# Patient Record
Sex: Female | Born: 1970 | Race: White | Hispanic: No | Marital: Married | Smoking: Never smoker
Health system: Southern US, Community
[De-identification: ages and names within clinical notes are randomized; demographics above are authoritative.]

## PROBLEM LIST (undated history)

## (undated) DIAGNOSIS — J302 Other seasonal allergic rhinitis: Secondary | ICD-10-CM

## (undated) DIAGNOSIS — J45909 Unspecified asthma, uncomplicated: Secondary | ICD-10-CM

## (undated) HISTORY — DX: Other seasonal allergic rhinitis: J30.2

## (undated) HISTORY — PX: CHOLECYSTECTOMY: SHX55

## (undated) HISTORY — DX: Unspecified asthma, uncomplicated: J45.909

---

## 2000-02-15 ENCOUNTER — Encounter: Admission: RE | Admit: 2000-02-15 | Discharge: 2000-05-15 | Payer: Self-pay | Admitting: Family Medicine

## 2007-11-16 ENCOUNTER — Emergency Department (HOSPITAL_COMMUNITY): Admission: EM | Admit: 2007-11-16 | Discharge: 2007-11-16 | Payer: Self-pay | Admitting: Emergency Medicine

## 2007-12-01 ENCOUNTER — Ambulatory Visit (HOSPITAL_COMMUNITY): Admission: RE | Admit: 2007-12-01 | Discharge: 2007-12-01 | Payer: Self-pay | Admitting: General Surgery

## 2007-12-01 ENCOUNTER — Encounter (INDEPENDENT_AMBULATORY_CARE_PROVIDER_SITE_OTHER): Payer: Self-pay | Admitting: General Surgery

## 2009-01-30 IMAGING — US US ABDOMEN COMPLETE
1 series · 14 of 25 positions shown · non-contrast
Comparison: NONE

CLINICAL DATA: c:  [REDACTED] and Patient   
Possible  gallstones. 

LIMITED ULTRASOUND OF THE RIGHT UPPER QUADRANT

[Series 1: us abd limited · 0.28mm/px · 14 of 48 slices shown]
[im 1/48]
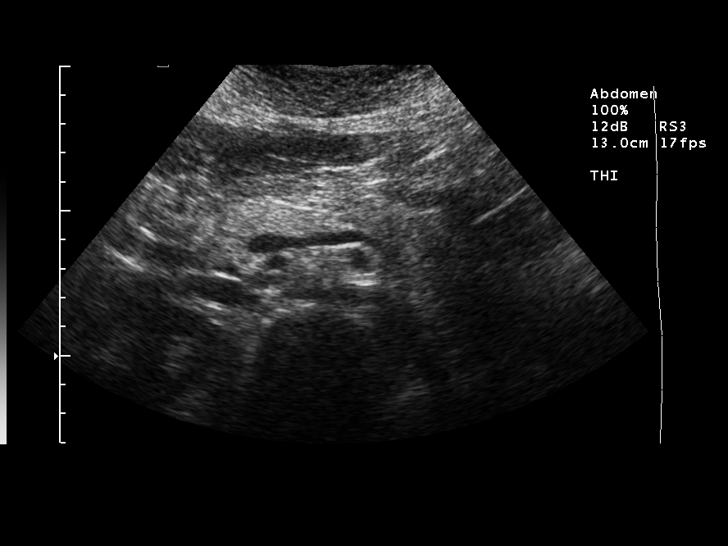
[im 4/48]
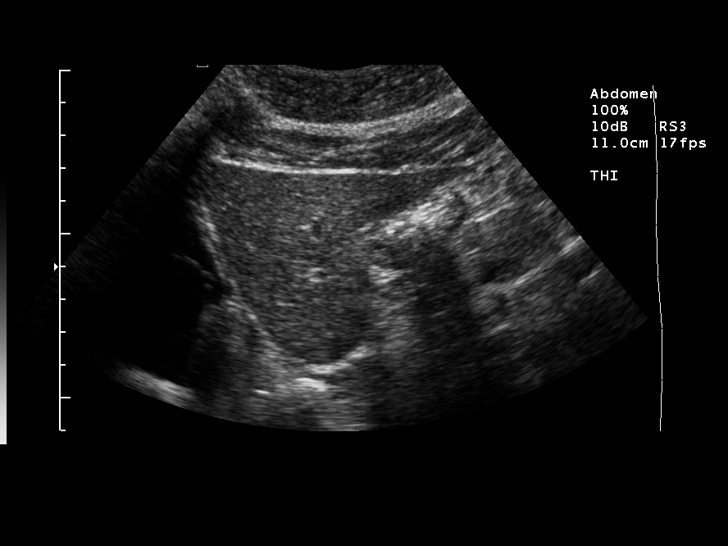
[im 8/48]
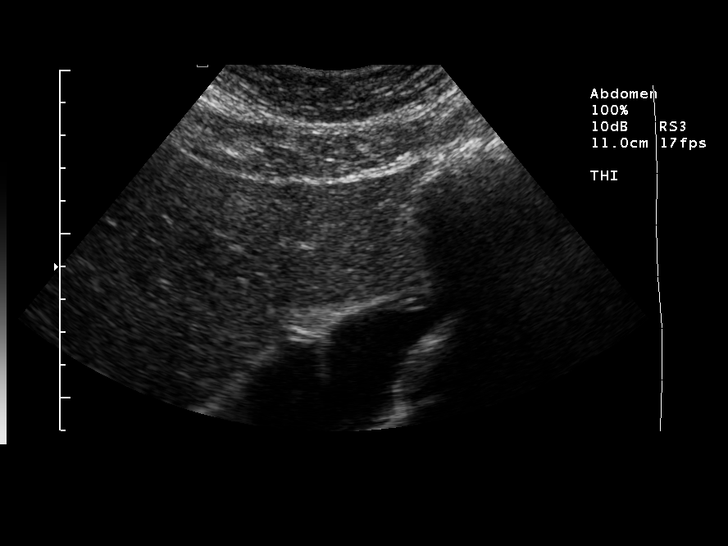
[im 12/48]
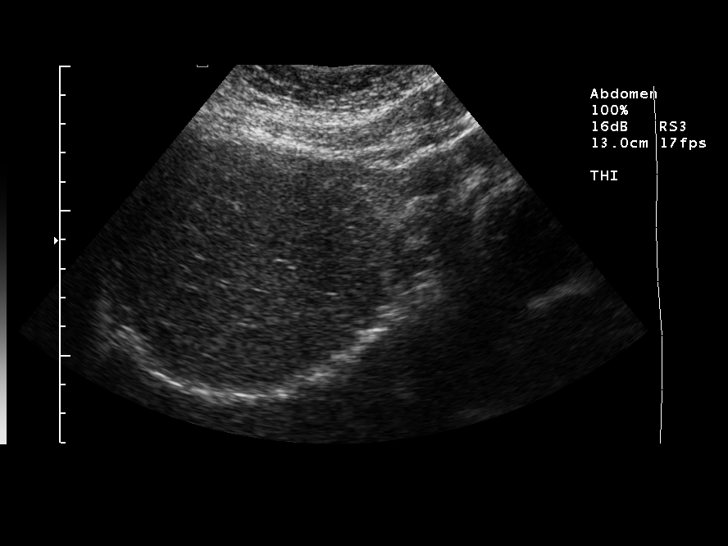
[im 16/48]
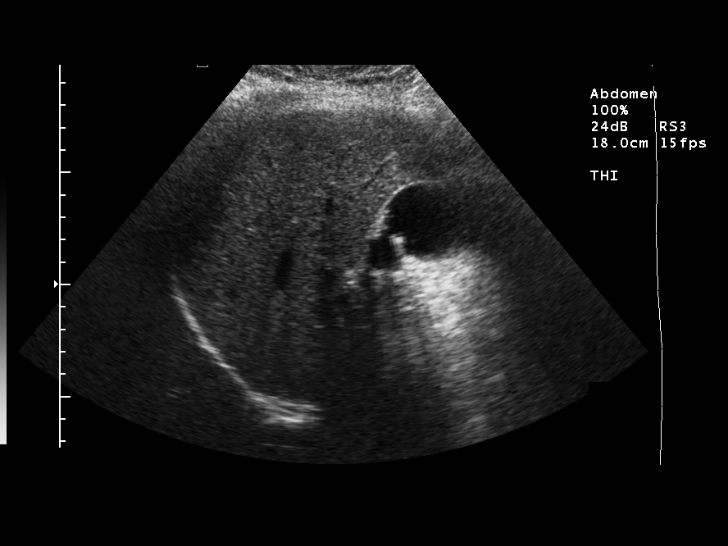
[im 18/48]
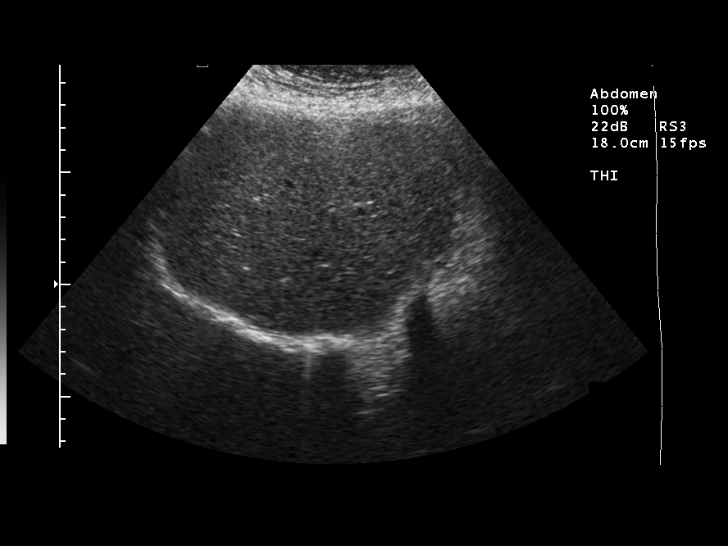
[im 22/48]
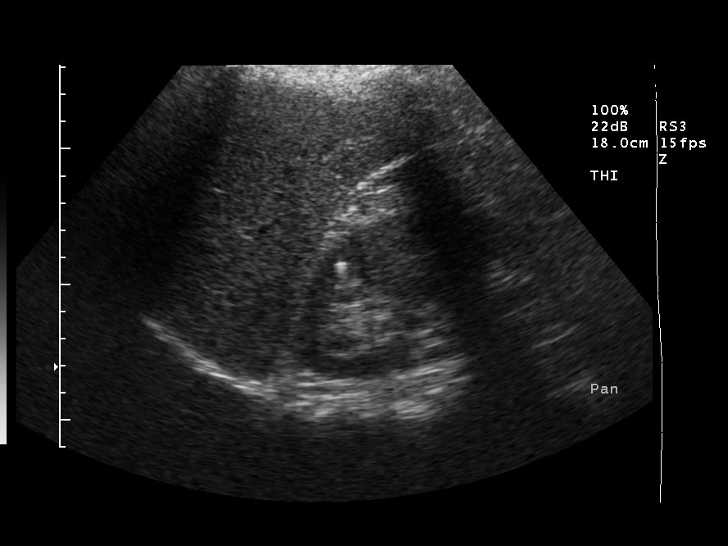
[im 26/48]
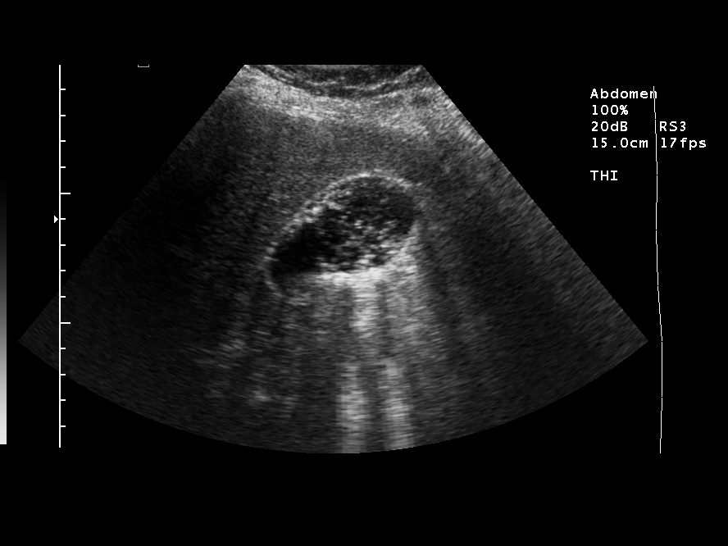
[im 30/48]
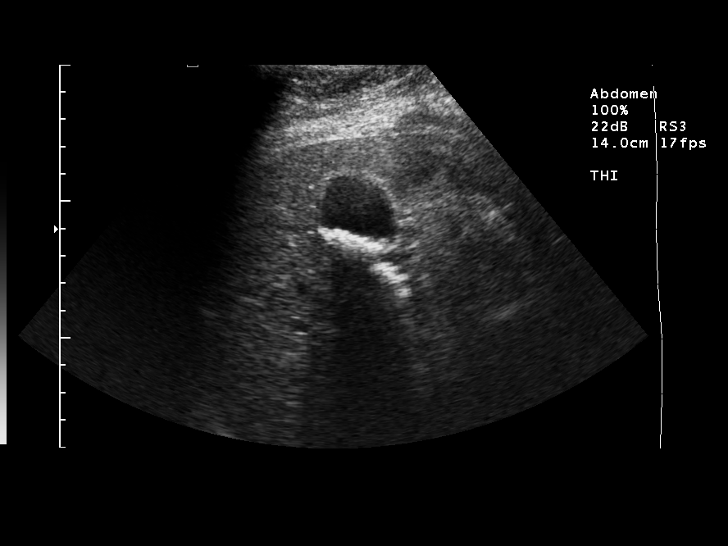
[im 32/48]
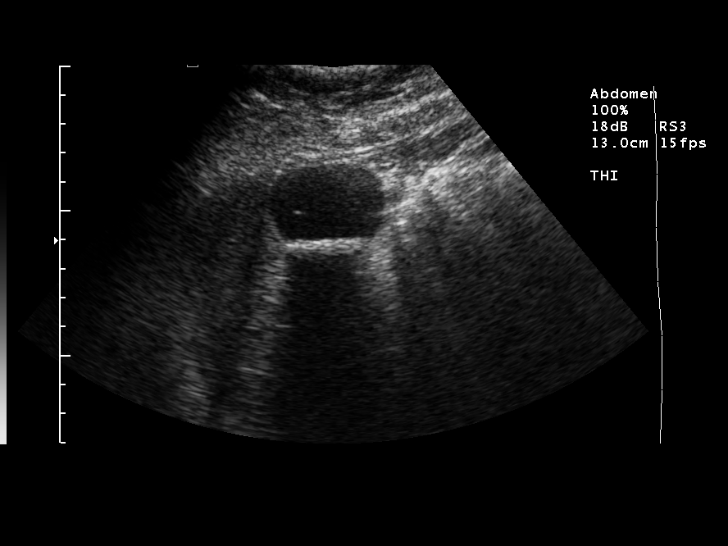
[im 36/48]
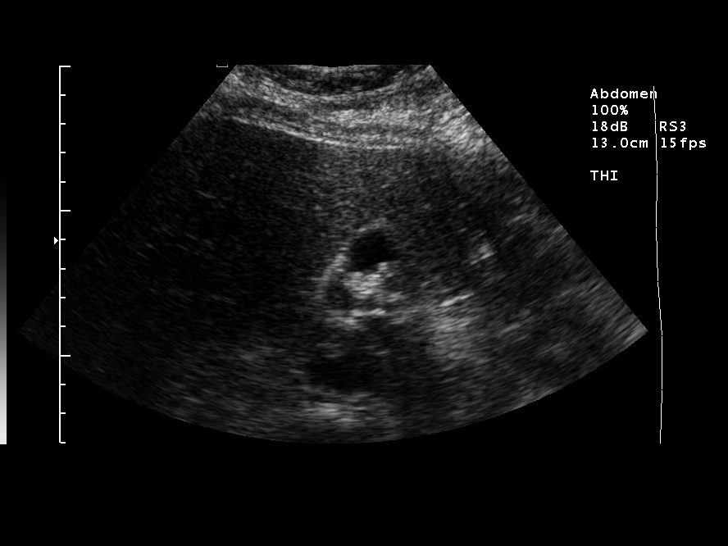
[im 40/48]
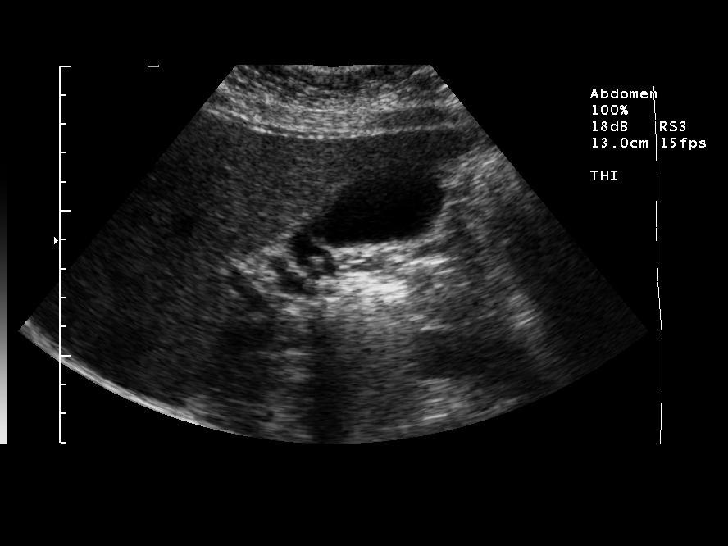
[im 44/48]
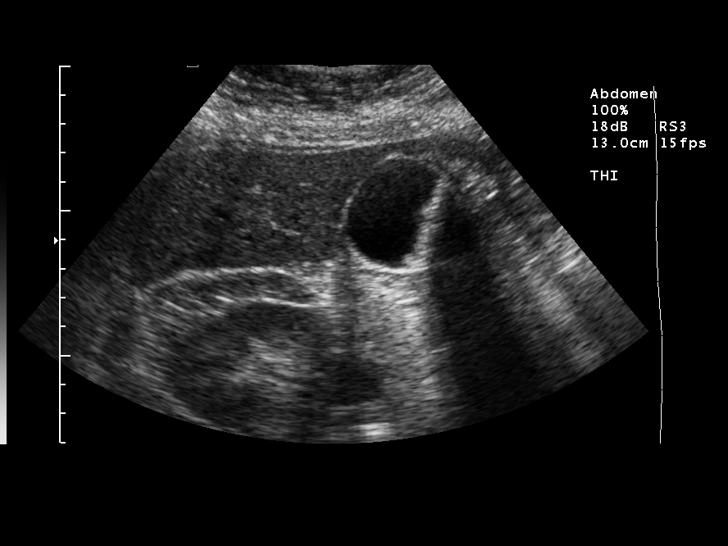
[im 48/48]
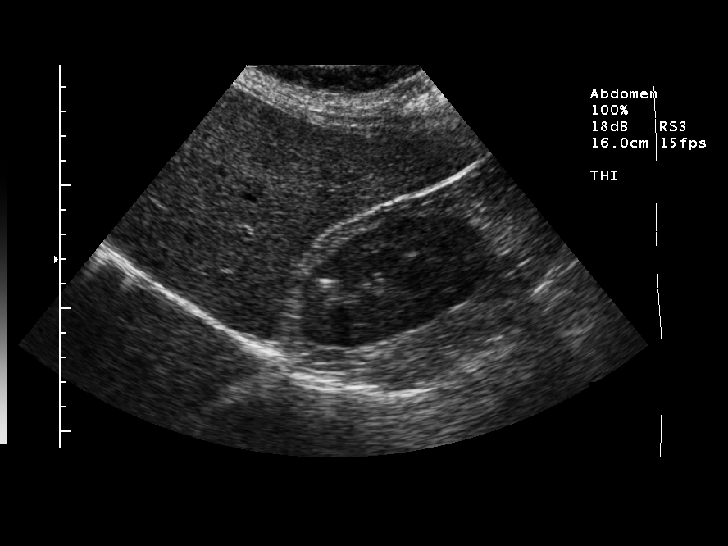

[14 of 25 positions shown; findings below may reference images not displayed]

FINDINGS: Prior CT urogram dated 11-10-07 was reviewed.  There 
was the suggestion of gallstone within the gallbladder. The liver 
is normal in size and echogenicity. Examination of the gallbladder 
demonstrates numerous shadowing echogenicities within the 
gallbladder consistent with gallstones.  The common duct was 
non-dilated measuring 0.34 cm. Non-shadowing material within the 
gallbladder also suggests associated sludge. The pancreas was 
normal in appearance. Views of the right kidney demonstrate a 
focal hyperechoic nodular structure in the upper pole cortex 
measuring 0.6 cm.  This demonstrates ring-down artifact suggestive 
of fatty tissue and this may represent a small angiomyolipoma.
IMPRESSION: Numerous tiny gallstones within the gallbladder with 
probable associated sludge. Normal width of the common bile duct. 
Probable tiny angiomyolipoma within the right kidney. Princess Marko 
Hallel, M.D. electronically reviewed on 11/16/2007 Dict Date: 
11/16/2007  Tran Date: 11/16/2007 CAV  JIM

## 2009-07-17 ENCOUNTER — Other Ambulatory Visit: Admission: RE | Admit: 2009-07-17 | Discharge: 2009-07-17 | Payer: Self-pay | Admitting: Obstetrics and Gynecology

## 2009-07-24 ENCOUNTER — Ambulatory Visit (HOSPITAL_COMMUNITY): Admission: RE | Admit: 2009-07-24 | Discharge: 2009-07-24 | Payer: Self-pay | Admitting: Obstetrics and Gynecology

## 2010-07-07 ENCOUNTER — Other Ambulatory Visit: Admission: RE | Admit: 2010-07-07 | Discharge: 2010-07-07 | Payer: Self-pay | Admitting: Obstetrics and Gynecology

## 2011-02-23 NOTE — Op Note (Signed)
NAME:  Sharon Mckenzie, Sharon Mckenzie              ACCOUNT NO.:  [id number]   MEDICAL RECORD NO.:  [id number]          PATIENT TYPE:  AMB   LOCATION:  SDS                          FACILITY:  [facility]   PHYSICIAN:  Cherylynn Ridges, M.D.    DATE OF BIRTH:  Aug 06, 1971   DATE OF PROCEDURE:  12/01/2007  DATE OF DISCHARGE:                               OPERATIVE REPORT   PREOPERATIVE DIAGNOSIS:  Symptomatic cholelithiasis.   POSTOPERATIVE DIAGNOSIS:  Symptomatic cholelithiasis.   PROCEDURE:  Laparoscopic cholecystectomy with cholangiogram.   SURGEON:  Cherylynn Ridges, M.D.   ASSISTANT:  Leonie Man, M.D.   ANESTHESIA:  General endotracheal.   ESTIMATED BLOOD LOSS:  Less than 20 mL.   COMPLICATIONS:  None.   CONDITION:  Stable.   INDICATIONS FOR OPERATION:  The patient is a 40 year old with  symptomatic gallstones who comes in now for an elective laparoscopic  cholecystectomy.   FINDINGS:  The patient had adhesions to the body and dome of the  gallbladder tenting up the duodenum.  Intraoperative cholangiogram was  normal with good flow into the duodenum and no filling defect   OPERATION:  The patient was taken to the operating room and placed on  the table in a supine position.  After an adequate general endotracheal  anesthetic was administered, she was prepped and draped in the usual  sterile manner exposing the midline and right upper quadrant.  A  supraumbilical curvilinear incision was made using a #11 blade and taken  down to the midline fascia.  We then split the fascia using a #15 blade  and then grabbed the edges with Kocher clamps.  We bluntly dissected  down to the peritoneal cavity with the Kelly clamp and then passed a  pursestring suture of 0 Vicryl around the fascial opening.  This secured  in a Hassan cannula which was subsequently passed through the fascial  opening into the peritoneal cavity.  Once the [facility] cannula was in  place, we insufflated carbon dioxide gas up to maximal  intra-abdominal  pressure of 15 mmHg.  Two right costal margin 5 mm cannulae and a  subxiphoid 11/12 mm cannula were passed under direct vision.  With all  cannulae in place, the patient was placed in reversed Trendelenburg, the  left side was tilted down, and we began the dissection..   A grasper was passed onto the body of the gallbladder and retracted  towards right upper quadrant and the anterior abdominal wall.  We then  dissected out the adhesions on the body and the dome of the gallbladder  using the dissector freeing up the infundibulum which was subsequently  retracted separately.  We opened up the peritoneum overlying the  triangle of Calot and hepatoduodenal triangle where a large cystic duct,  cystic artery was identified and clipped proximally and distally and  transected.  The cystic duct was clipped along the gallbladder side and  a cholecystoductotomy made through which a cholangiocatheter was passed  through the anterior abdominal wall and was passed for the  cholangiogram.  It was secured in place with the clip.  Cholangiogram  showed good flow into the duodenum, no filling defects, good proximal  flow, and no ductal dilatation.   Once the cholangiogram was completed, the clip securing it in place was  removed, the cholangiocatheter was removed from the abdominal cavity,  and distal clips x2 were placed on the cystic duct.  We transected the  cystic duct and dissected out the gallbladder from its bed with minimal  difficulty.  We retrieved it from the supraumbilical site using a  laparoscopic grasper.  Once this was done, we inspected the bed for  hemostasis and there was minimal bleeding.  We did cauterize the liver  bed.  We closed the supraumbilical fascial site using the pursestring  suture which was in place. We irrigated with saline and aspirated all  fluid and gas from above the liver as we removed all cannulae.  Once  this was done, 0.25% Marcaine with epi was  injected at all sites.  The  skin at the supraumbilical and subxiphoid sites were closed using  running subcuticular stitch of 4-0 Vicryl.  The lateral cannulae sites  were closed with Dermabond.  Dermabond was applied to the closed  incisions at the supraumbilical and subxiphoid sites.  Steri-Strips were  applied along with Tegaderm.  All needle counts, sponge counts and  instrument counts were correct.      Cherylynn Ridges, M.D.  Electronically Signed     JOW/MEDQ  D:  12/01/2007  T:  12/02/2007  Job:  [id number]   cc:   Oley Balm. Georgina Pillion, M.D.

## 2011-07-01 ENCOUNTER — Other Ambulatory Visit: Payer: Self-pay | Admitting: Family Medicine

## 2011-07-02 LAB — URINALYSIS, ROUTINE W REFLEX MICROSCOPIC
Bilirubin Urine: NEGATIVE
Specific Gravity, Urine: 1.024
Urobilinogen, UA: 0.2
pH: 5

## 2011-07-02 LAB — COMPREHENSIVE METABOLIC PANEL
ALT: 37 — ABNORMAL HIGH
ALT: 26
AST: 37
Chloride: 104
Creatinine, Ser: 0.72
GFR calc non Af Amer: >60
Glucose, Bld: 128 — ABNORMAL HIGH
Potassium: 3.6
Sodium: 137
Sodium: 138
Total Protein: 7.4
Total Protein: 7.5

## 2011-07-02 LAB — URINE MICROSCOPIC-ADD ON

## 2011-07-02 LAB — POCT PREGNANCY, URINE: Preg Test, Ur: NEGATIVE

## 2011-07-02 LAB — DIFFERENTIAL
Basophils Absolute: 0.1
Basophils Relative: 0
Eosinophils Absolute: 0.2
Eosinophils Relative: 2
Eosinophils Relative: 4
Lymphocytes Relative: 12
Lymphocytes Relative: 27
Lymphs Abs: 1.9
Lymphs Abs: 2.4
Monocytes Relative: 7
Neutro Abs: 12.6 — ABNORMAL HIGH
Neutro Abs: 5.5
Neutrophils Relative %: 82 — ABNORMAL HIGH
Neutrophils Relative %: 62

## 2011-07-02 LAB — LIPASE, BLOOD: Lipase: 29

## 2011-07-02 LAB — CBC
MCV: 87.7
Platelets: 283
RDW: 12.7
WBC: 15.3 — ABNORMAL HIGH

## 2011-09-09 ENCOUNTER — Other Ambulatory Visit (HOSPITAL_COMMUNITY)
Admission: RE | Admit: 2011-09-09 | Discharge: 2011-09-09 | Disposition: A | Discharge status: Home / Self Care | Payer: BC Managed Care – PPO | Source: Ambulatory Visit | Attending: Obstetrics and Gynecology | Admitting: Obstetrics and Gynecology

## 2011-09-09 ENCOUNTER — Other Ambulatory Visit: Payer: Self-pay | Admitting: Nurse Practitioner

## 2011-09-09 DIAGNOSIS — Z01419 Encounter for gynecological examination (general) (routine) without abnormal findings: Secondary | ICD-10-CM | POA: Insufficient documentation

## 2012-07-03 ENCOUNTER — Encounter (INDEPENDENT_AMBULATORY_CARE_PROVIDER_SITE_OTHER): Payer: Self-pay | Admitting: General Surgery

## 2012-07-11 ENCOUNTER — Ambulatory Visit (INDEPENDENT_AMBULATORY_CARE_PROVIDER_SITE_OTHER): Payer: Self-pay | Admitting: Surgery

## 2012-07-14 ENCOUNTER — Ambulatory Visit (INDEPENDENT_AMBULATORY_CARE_PROVIDER_SITE_OTHER): Payer: BC Managed Care – PPO | Admitting: Surgery

## 2012-07-26 ENCOUNTER — Ambulatory Visit (INDEPENDENT_AMBULATORY_CARE_PROVIDER_SITE_OTHER): Payer: Self-pay | Admitting: Surgery

## 2012-09-13 ENCOUNTER — Ambulatory Visit (INDEPENDENT_AMBULATORY_CARE_PROVIDER_SITE_OTHER): Payer: 59 | Admitting: Family Medicine

## 2012-09-13 ENCOUNTER — Encounter: Payer: Self-pay | Admitting: Family Medicine

## 2012-09-13 VITALS — BP 120/84 | HR 68 | Temp 98.1°F | Ht 65.75 in | Wt 214.0 lb

## 2012-09-13 DIAGNOSIS — Z0000 Encounter for general adult medical examination without abnormal findings: Secondary | ICD-10-CM

## 2012-09-13 DIAGNOSIS — Z136 Encounter for screening for cardiovascular disorders: Secondary | ICD-10-CM

## 2012-09-13 LAB — COMPREHENSIVE METABOLIC PANEL
Alkaline Phosphatase: 64 U/L (ref 39–117)
CO2: 29 mEq/L (ref 19–32)
Creatinine, Ser: 0.7 mg/dL (ref 0.4–1.2)
Glucose, Bld: 99 mg/dL (ref 70–99)
Sodium: 136 mEq/L (ref 135–145)
Total Bilirubin: 0.7 mg/dL (ref 0.3–1.2)

## 2012-09-13 LAB — LIPID PANEL
Cholesterol: 233 mg/dL — ABNORMAL HIGH (ref 0–200)
Total CHOL/HDL Ratio: 5
Triglycerides: 169 mg/dL — ABNORMAL HIGH (ref 0.0–149.0)
VLDL: 33.8 mg/dL (ref 0.0–40.0)

## 2012-09-13 LAB — LDL CHOLESTEROL, DIRECT: Direct LDL: 164.6 mg/dL

## 2012-09-13 NOTE — Progress Notes (Signed)
Subjective:    Patient ID: Sharon Mckenzie, female    DOB: September 14, 1971, 41 y.o.   MRN: [id number]  HPI  Very pleasant 41 yo G0 here to establish care and for CPX.  Last pap smear normal 08/2011.  No h/o abnormal pap smears.  She is sexually active with one partner only. No family history of breast, uterine, cervical or ovarian cancer. Denies any dysuria or vaginal discharge.  Has had a flu shot.  Due for routine lab work.  She did fall on stairs a few weeks- tail bone very sore but that is improving. No LE radiculopathy or weakness.  Patient Active Problem List  Diagnosis  . Routine general medical examination at a health care facility   Past Medical History  Diagnosis Date  . Asthma   . Seasonal allergies    Past Surgical History  Procedure Date  . Cholecystectomy    History  Substance Use Topics  . Smoking status: Never Smoker   . Smokeless tobacco: Not on file  . Alcohol Use: 1.0 - 1.5 oz/week    2-3 drink(s) per week   Family History  Problem Relation Age of Onset  . Hyperlipidemia Mother   . Hypertension Mother    No Known Allergies Current Outpatient Prescriptions on File Prior to Visit  Medication Sig Dispense Refill  . levonorgestrel (MIRENA) 20 MCG/24HR IUD 1 each by Intrauterine route once.      . Multiple Vitamin (MULTIVITAMIN) tablet Take 1 tablet by mouth daily.       The PMH, PSH, Social History, Family History, Medications, and allergies have been reviewed in [facility], and have been updated if relevant.    Review of Systems See HPI Patient reports no  vision/ hearing changes,anorexia, weight change, fever ,adenopathy, persistant / recurrent hoarseness, swallowing issues, chest pain, edema,persistant / recurrent cough, hemoptysis, dyspnea(rest, exertional, paroxysmal nocturnal), gastrointestinal  bleeding (melena, rectal bleeding), abdominal pain, excessive heart burn, GU symptoms(dysuria, hematuria, pyuria, voiding/incontinence  Issues) syncope, focal  weakness, severe memory loss, concerning skin lesions, depression, anxiety, abnormal bruising/bleeding, major joint swelling, breast masses or abnormal vaginal bleeding.       Objective:   Physical Exam BP 120/84  Pulse 68  Temp 98.1 F (36.7 C)  Ht 5' 5.75" (1.67 m)  Wt 214 lb (97.07 kg)  BMI 34.80 kg/m2  General:  Well-developed,well-nourished,in no acute distress; alert,appropriate and cooperative throughout examination Head:  normocephalic and atraumatic.   Eyes:  vision grossly intact, pupils equal, pupils round, and pupils reactive to light.   Ears:  R ear normal and L ear normal.   Nose:  no external deformity.   Mouth:  good dentition.   Neck:  No deformities, masses, or tenderness noted. Breasts:  No mass, nodules, thickening, tenderness, bulging, retraction, inflamation, nipple discharge or skin changes noted.   Lungs:  Normal respiratory effort, chest expands symmetrically. Lungs are clear to auscultation, no crackles or wheezes. Heart:  Normal rate and regular rhythm. S1 and S2 normal without gallop, murmur, click, rub or other extra sounds. Abdomen:  Bowel sounds positive,abdomen soft and non-tender without masses, organomegaly or hernias noted. Msk:  No deformity or scoliosis noted of thoracic or lumbar spine.   Extremities:  No clubbing, cyanosis, edema, or deformity noted with normal full range of motion of all joints.   Neurologic:  alert & oriented X3 and gait normal.   Skin:  Intact without suspicious lesions or rashes Cervical Nodes:  No lymphadenopathy noted Axillary Nodes:  No palpable lymphadenopathy Psych:  Cognition and judgment appear intact. Alert and cooperative with normal attention span and concentration. No apparent delusions, illusions, hallucinations     Assessment & Plan:   1. Routine general medical examination at a health care facility  Reviewed preventive care protocols, scheduled due services, and updated immunizations Discussed nutrition,  exercise, diet, and healthy lifestyle.  Comprehensive metabolic panel  2. Screening for ischemic heart disease  Lipid Panel

## 2012-09-13 NOTE — Patient Instructions (Addendum)
It was wonderful to meet you. We will call you with your lab results.  Have a wonderful holiday season.   Health Maintenance, Females A healthy lifestyle and preventative care can promote health and wellness.  Maintain regular health, dental, and eye exams.  Eat a healthy diet. Foods like vegetables, fruits, whole grains, low-fat dairy products, and lean protein foods contain the nutrients you need without too many calories. Decrease your intake of foods high in solid fats, added sugars, and salt. Get information about a proper diet from your caregiver, if necessary.  Regular physical exercise is one of the most important things you can do for your health. Most adults should get at least 150 minutes of moderate-intensity exercise (any activity that increases your heart rate and causes you to sweat) each week. In addition, most adults need muscle-strengthening exercises on 2 or more days a week.   Maintain a healthy weight. The body mass index (BMI) is a screening tool to identify possible weight problems. It provides an estimate of body fat based on height and weight. Your caregiver can help determine your BMI, and can help you achieve or maintain a healthy weight. For adults 20 years and older:  A BMI below 18.5 is considered underweight.  A BMI of 18.5 to 24.9 is normal.  A BMI of 25 to 29.9 is considered overweight.  A BMI of 30 and above is considered obese.  Maintain normal blood lipids and cholesterol by exercising and minimizing your intake of saturated fat. Eat a balanced diet with plenty of fruits and vegetables. Blood tests for lipids and cholesterol should begin at age 48 and be repeated every 5 years. If your lipid or cholesterol levels are high, you are over 50, or you are a high risk for heart disease, you may need your cholesterol levels checked more frequently.Ongoing high lipid and cholesterol levels should be treated with medicines if diet and exercise are not  effective.  If you smoke, find out from your caregiver how to quit. If you do not use tobacco, do not start.  If you are pregnant, do not drink alcohol. If you are breastfeeding, be very cautious about drinking alcohol. If you are not pregnant and choose to drink alcohol, do not exceed 1 drink per day. One drink is considered to be 12 ounces (355 mL) of beer, 5 ounces (148 mL) of wine, or 1.5 ounces (44 mL) of liquor.  Avoid use of street drugs. Do not share needles with anyone. Ask for help if you need support or instructions about stopping the use of drugs.  High blood pressure causes heart disease and increases the risk of stroke. Blood pressure should be checked at least every 1 to 2 years. Ongoing high blood pressure should be treated with medicines, if weight loss and exercise are not effective.  If you are 93 to 39 years old, ask your caregiver if you should take aspirin to prevent strokes.  Diabetes screening involves taking a blood sample to check your fasting blood sugar level. This should be done once every 3 years, after age 44, if you are within normal weight and without risk factors for diabetes. Testing should be considered at a younger age or be carried out more frequently if you are overweight and have at least 1 risk factor for diabetes.  Breast cancer screening is essential preventative care for women. You should practice "breast self-awareness." This means understanding the normal appearance and feel of your breasts and may include breast  self-examination. Any changes detected, no matter how small, should be reported to a caregiver. Women in their 46s and 30s should have a clinical breast exam (CBE) by a caregiver as part of a regular health exam every 1 to 3 years. After age 35, women should have a CBE every year. Starting at age 64, women should consider having a mammogram (breast X-ray) every year. Women who have a family history of breast cancer should talk to their caregiver  about genetic screening. Women at a high risk of breast cancer should talk to their caregiver about having an MRI and a mammogram every year.  The Pap test is a screening test for cervical cancer. Women should have a Pap test starting at age 55. Between ages 2 and 24, Pap tests should be repeated every 2 years. Beginning at age 35, you should have a Pap test every 3 years as long as the past 3 Pap tests have been normal. If you had a hysterectomy for a problem that was not cancer or a condition that could lead to cancer, then you no longer need Pap tests. If you are between ages 56 and 44, and you have had normal Pap tests going back 10 years, you no longer need Pap tests. If you have had past treatment for cervical cancer or a condition that could lead to cancer, you need Pap tests and screening for cancer for at least 20 years after your treatment. If Pap tests have been discontinued, risk factors (such as a new sexual partner) need to be reassessed to determine if screening should be resumed. Some women have medical problems that increase the chance of getting cervical cancer. In these cases, your caregiver may recommend more frequent screening and Pap tests.  The human papillomavirus (HPV) test is an additional test that may be used for cervical cancer screening. The HPV test looks for the virus that can cause the cell changes on the cervix. The cells collected during the Pap test can be tested for HPV. The HPV test could be used to screen women aged 66 years and older, and should be used in women of any age who have unclear Pap test results. After the age of 52, women should have HPV testing at the same frequency as a Pap test.  Colorectal cancer can be detected and often prevented. Most routine colorectal cancer screening begins at the age of 67 and continues through age 32. However, your caregiver may recommend screening at an earlier age if you have risk factors for colon cancer. On a yearly basis,  your caregiver may provide home test kits to check for hidden blood in the stool. Use of a small camera at the end of a tube, to directly examine the colon (sigmoidoscopy or colonoscopy), can detect the earliest forms of colorectal cancer. Talk to your caregiver about this at age 54, when routine screening begins. Direct examination of the colon should be repeated every 5 to 10 years through age 59, unless early forms of pre-cancerous polyps or small growths are found.  Hepatitis C blood testing is recommended for all people born from [birth year] through 1963 and any individual with known risks for hepatitis C.  Practice safe sex. Use condoms and avoid high-risk sexual practices to reduce the spread of sexually transmitted infections (STIs). Sexually active women aged 76 and younger should be checked for Chlamydia, which is a common sexually transmitted infection. Older women with new or multiple partners should also be tested for Chlamydia. Testing  for other STIs is recommended if you are sexually active and at increased risk.  Osteoporosis is a disease in which the bones lose minerals and strength with aging. This can result in serious bone fractures. The risk of osteoporosis can be identified using a bone density scan. Women ages 8 and over and women at risk for fractures or osteoporosis should discuss screening with their caregivers. Ask your caregiver whether you should be taking a calcium supplement or vitamin D to reduce the rate of osteoporosis.  Menopause can be associated with physical symptoms and risks. Hormone replacement therapy is available to decrease symptoms and risks. You should talk to your caregiver about whether hormone replacement therapy is right for you.  Use sunscreen with a sun protection factor (SPF) of 30 or greater. Apply sunscreen liberally and repeatedly throughout the day. You should seek shade when your shadow is shorter than you. Protect yourself by wearing long sleeves, pants,  a wide-brimmed hat, and sunglasses year round, whenever you are outdoors.  Notify your caregiver of new moles or changes in moles, especially if there is a change in shape or color. Also notify your caregiver if a mole is larger than the size of a pencil eraser.  Stay current with your immunizations. Document Released: 04/12/2011 Document Revised: 12/20/2011 Document Reviewed: 04/12/2011 [facility] Patient Information 2013 [place], [place].

## 2012-10-06 ENCOUNTER — Ambulatory Visit (INDEPENDENT_AMBULATORY_CARE_PROVIDER_SITE_OTHER): Payer: 59 | Admitting: Family Medicine

## 2012-10-06 ENCOUNTER — Encounter: Payer: Self-pay | Admitting: Family Medicine

## 2012-10-06 VITALS — BP 124/78 | HR 111 | Temp 99.1°F | Ht 65.75 in | Wt 217.2 lb

## 2012-10-06 DIAGNOSIS — J069 Acute upper respiratory infection, unspecified: Secondary | ICD-10-CM

## 2012-10-06 DIAGNOSIS — R21 Rash and other nonspecific skin eruption: Secondary | ICD-10-CM

## 2012-10-06 NOTE — Progress Notes (Signed)
  Subjective:    Patient ID: Sharon Mckenzie, female    DOB: October 24, 1970, 41 y.o.   MRN: [id number]  HPI Here for fever and rash   On dec 25th- had a rash on back of her neck - blistery, a little uncomfortable but not itchy- on both sides of neck  Yesterday- pressure on both sides of face Also a fever  100 max  No aches or chills  Ears feel full , and she has post nasal drip  No recent illness  Cough at night from post nasal drip   She did have her flu shot  Patient Active Problem List  Diagnosis  . Routine general medical examination at a health care facility   Past Medical History  Diagnosis Date  . Asthma   . Seasonal allergies    Past Surgical History  Procedure Date  . Cholecystectomy    History  Substance Use Topics  . Smoking status: Never Smoker   . Smokeless tobacco: Not on file  . Alcohol Use: 1.0 - 1.5 oz/week    2-3 drink(s) per week   Family History  Problem Relation Age of Onset  . Hyperlipidemia Mother   . Hypertension Mother    No Known Allergies Current Outpatient Prescriptions on File Prior to Visit  Medication Sig Dispense Refill  . levonorgestrel (MIRENA) 20 MCG/24HR IUD 1 each by Intrauterine route once.      . Multiple Vitamin (MULTIVITAMIN) tablet Take 1 tablet by mouth daily.          Review of Systems Review of Systems  Constitutional: Negative for appetite change,  and unexpected weight change.  Eyes: Negative for pain and visual disturbance.  ENT pos for congestion and ST and post nasal drip and neg for sinus tenderness  Respiratory: Negative for wheeze  and shortness of breath.   Cardiovascular: Negative for cp or palpitations    Gastrointestinal: Negative for nausea, diarrhea and constipation.  Genitourinary: Negative for urgency and frequency.  Skin: Negative for pallor and pos for resolving rash   Neurological: Negative for weakness, light-headedness, numbness and headaches.  Hematological: Negative for adenopathy. Does not  bruise/bleed easily.  Psychiatric/Behavioral: Negative for dysphoric mood. The patient is not nervous/anxious.         Objective:   Physical Exam  Constitutional: She appears well-developed and well-nourished. No distress.  HENT:  Head: Normocephalic and atraumatic.  Left Ear: External ear normal.  Mouth/Throat: Oropharynx is clear and moist. No oropharyngeal exudate.       Nares are injected and congested  No sinus tenderness Clear post nasal drip   Eyes: Conjunctivae normal and EOM are normal. Pupils are equal, round, and reactive to light. Right eye exhibits no discharge. Left eye exhibits no discharge. No scleral icterus.  Neck: Normal range of motion. Neck supple. No JVD present. No thyromegaly present.  Cardiovascular: Normal rate and regular rhythm.   Pulmonary/Chest: Effort normal and breath sounds normal. No respiratory distress. She has no wheezes. She has no rales.  Abdominal: Soft. Bowel sounds are normal.  Lymphadenopathy:    She has no cervical adenopathy.  Neurological: She is alert.  Skin: Skin is warm and dry. Rash noted. No erythema. No pallor.       Faint area of injection with papules on the back of neck- is dry- looks to be resolving   Psychiatric: She has a normal mood and affect.          Assessment & Plan:

## 2012-10-06 NOTE — Assessment & Plan Note (Signed)
This is almost totally resolved on its own- suspect allergic Reviewed her exposures Disc symptomatic care - see instructions on AVS  Update if not starting to improve in a week or if worsening

## 2012-10-06 NOTE — Patient Instructions (Addendum)
For rash- avoid fragrances and harsh soaps Try cort aid otc - update if no improvement For cold/ cough- zyrtec otc, fluids/ rest/ nasal saline spray and mucinex DM for cough if needed  Tylenol for fever  Update if not starting to improve in a week or if worsening

## 2012-10-06 NOTE — Assessment & Plan Note (Signed)
In early stages Disc symptomatic care - see instructions on AVS  Update if not starting to improve in a week or if worsening

## 2013-02-02 ENCOUNTER — Encounter: Payer: Self-pay | Admitting: Family Medicine

## 2013-02-02 ENCOUNTER — Ambulatory Visit (INDEPENDENT_AMBULATORY_CARE_PROVIDER_SITE_OTHER): Payer: 59 | Admitting: Family Medicine

## 2013-02-02 VITALS — BP 120/84 | HR 82 | Temp 98.4°F | Ht 65.75 in | Wt 217.8 lb

## 2013-02-02 DIAGNOSIS — B3731 Acute candidiasis of vulva and vagina: Secondary | ICD-10-CM | POA: Insufficient documentation

## 2013-02-02 DIAGNOSIS — B373 Candidiasis of vulva and vagina: Secondary | ICD-10-CM

## 2013-02-02 MED ORDER — FLUCONAZOLE 150 MG PO TABS: 150.0000 mg | ORAL_TABLET | Freq: Once | ORAL | Status: DC

## 2013-02-02 NOTE — Assessment & Plan Note (Signed)
Treat with diflucan x 1.  

## 2013-02-02 NOTE — Progress Notes (Signed)
  Subjective:    Patient ID: Sharon Mckenzie, female    DOB: [birth year], 42 y.o.   MRN: [id number]  Vaginal Itching The patient's primary symptoms include genital itching, a genital odor and a vaginal discharge. The patient's pertinent negatives include no genital lesions, genital rash, pelvic pain or vaginal bleeding. This is a new problem. The current episode started in the past 7 days. The problem occurs constantly. The patient is experiencing no pain. She is not pregnant. Pertinent negatives include no abdominal pain, chills, constipation, diarrhea, dysuria, fever, frequency, hematuria, nausea, urgency or vomiting. The vaginal discharge was white, thick and copious. There has been no bleeding. She has tried nothing for the symptoms. She is sexually active. No, her partner does not have an STD. She uses nothing for contraception. (No history of monistat.)      Review of Systems  Constitutional: Negative for fever and chills.  Gastrointestinal: Negative for nausea, vomiting, abdominal pain, diarrhea and constipation.  Genitourinary: Positive for vaginal discharge. Negative for dysuria, urgency, frequency, hematuria and pelvic pain.       Objective:   Physical Exam  Constitutional: She appears well-developed and well-nourished.  Eyes: Conjunctivae are normal. Pupils are equal, round, and reactive to light.  Cardiovascular: Normal rate and regular rhythm.   Pulmonary/Chest: Effort normal and breath sounds normal.  Abdominal: Soft. Bowel sounds are normal. There is no tenderness.  Genitourinary: There is rash on the right labia. There is rash on the left labia. There is erythema and tenderness around the vagina. No bleeding around the vagina.          Assessment & Plan:

## 2013-02-02 NOTE — Patient Instructions (Addendum)
Call if not improving as expected. 

## 2013-02-05 ENCOUNTER — Telehealth: Payer: Self-pay

## 2013-02-05 NOTE — Telephone Encounter (Signed)
Pt seen 02/02/13; pt took Diflucan on 02/02/13 and vaginal discharge, itching and odor is better but not gone. Pt did not know if needed more med or needs to give more time to work.Please advise. Pt request call back. [facility].

## 2013-02-05 NOTE — Telephone Encounter (Signed)
I would try to give it a little more time.  If no improvement, please let us know in next few days.

## 2013-02-05 NOTE — Telephone Encounter (Signed)
Advised patient as instructed, she agreed to give more time.

## 2013-02-15 ENCOUNTER — Other Ambulatory Visit (INDEPENDENT_AMBULATORY_CARE_PROVIDER_SITE_OTHER): Payer: 59

## 2013-02-15 ENCOUNTER — Encounter: Payer: Self-pay | Admitting: *Deleted

## 2013-02-15 DIAGNOSIS — E785 Hyperlipidemia, unspecified: Secondary | ICD-10-CM

## 2013-02-15 LAB — LIPID PANEL
HDL: 39.6 mg/dL (ref >39.00)
Total CHOL/HDL Ratio: 5
Triglycerides: 101 mg/dL (ref 0.0–149.0)
VLDL: 20.2 mg/dL (ref 0.0–40.0)

## 2013-03-22 ENCOUNTER — Telehealth: Payer: Self-pay | Admitting: Family Medicine

## 2013-03-22 NOTE — Telephone Encounter (Signed)
Patient Information:  Caller Name: Calista  Phone: [phone]  Patient: Sharon Mckenzie, Sharon Mckenzie  Gender: Female  DOB: 1971/05/27  Age: 42 Years  PCP: Ruthe Mannan [facility])  Pregnant: No  Office Follow Up:  Does the office need to follow up with this patient?: Yes  Instructions For The Office: Pt requesting recommendation to Podiatrist.  PLEASE F/U WITH PT, THANK YOU.   Symptoms  Reason For Call & Symptoms: Pt states she has pain on the ball of the right foot.  Pt requesting recommendation to a Podiatrist in [place] or [place].  Reviewed Health History In EMR: Yes  Reviewed Medications In EMR: Yes  Reviewed Allergies In EMR: Yes  Reviewed Surgeries / Procedures: Yes  Date of Onset of Symptoms: 02/19/2013 OB / GYN:  LMP: Unknown  Guideline(s) Used:  Foot Pain  Disposition Per Guideline:   See Within 2 Weeks in Office  Reason For Disposition Reached:   Mild pain (e.g., does not interfere with normal activities) and present > 7 days  Advice Given:  Call Back If:  Swelling, redness, or fever occur  You become worse.  Patient Will Follow Care Advice:  YES

## 2013-03-23 NOTE — Telephone Encounter (Signed)
I think a lot of people have been happy with Dr. Al Corpus.

## 2013-03-23 NOTE — Telephone Encounter (Signed)
Advised patient.  She will call back if she needs a referral.

## 2013-05-04 ENCOUNTER — Ambulatory Visit: Payer: 59 | Admitting: Family Medicine

## 2013-09-04 ENCOUNTER — Telehealth: Payer: Self-pay

## 2013-09-04 NOTE — Telephone Encounter (Signed)
Pt left v/m requesting prescription for first preventative mammogram; pt wants to go to [facility] in [place]. Pt would like to pick up when ready.Please advise.

## 2013-09-04 NOTE — Telephone Encounter (Signed)
She is due for her physical in 09/2013, we will order this at her visit, she needs to make an appt if she doesn't already have one

## 2013-09-07 NOTE — Telephone Encounter (Signed)
Pt called back re: mammo order. She has appt scheduled for cpx with Dr Dayton Martes on 11/07/13 (the earliest cpx appt available), but she says she wants to have her mammo done this year before her insurance switches over.

## 2013-09-10 ENCOUNTER — Other Ambulatory Visit: Payer: Self-pay | Admitting: Internal Medicine

## 2013-09-10 DIAGNOSIS — Z1239 Encounter for other screening for malignant neoplasm of breast: Secondary | ICD-10-CM

## 2013-09-10 NOTE — Telephone Encounter (Signed)
mammo ordered.

## 2013-09-13 ENCOUNTER — Ambulatory Visit: Payer: Self-pay | Admitting: Family Medicine

## 2013-09-14 ENCOUNTER — Encounter: Payer: Self-pay | Admitting: Family Medicine

## 2013-09-18 NOTE — Telephone Encounter (Signed)
Left message on voice mail  to call back

## 2013-09-26 ENCOUNTER — Ambulatory Visit: Payer: Self-pay | Admitting: Family Medicine

## 2013-09-26 ENCOUNTER — Encounter: Payer: Self-pay | Admitting: Family Medicine

## 2013-09-28 ENCOUNTER — Ambulatory Visit: Payer: Self-pay | Admitting: Family Medicine

## 2013-09-28 ENCOUNTER — Encounter: Payer: Self-pay | Admitting: Family Medicine

## 2013-09-28 NOTE — Telephone Encounter (Signed)
Left message on voicemail.

## 2013-11-07 ENCOUNTER — Encounter: Payer: 59 | Admitting: Family Medicine

## 2013-11-22 ENCOUNTER — Encounter: Payer: Self-pay | Admitting: Family Medicine

## 2013-11-22 ENCOUNTER — Ambulatory Visit (INDEPENDENT_AMBULATORY_CARE_PROVIDER_SITE_OTHER): Payer: 59 | Admitting: Family Medicine

## 2013-11-22 ENCOUNTER — Encounter: Payer: Self-pay | Admitting: *Deleted

## 2013-11-22 VITALS — BP 118/70 | HR 92 | Temp 98.4°F | Ht 66.0 in | Wt 215.5 lb

## 2013-11-22 DIAGNOSIS — Z136 Encounter for screening for cardiovascular disorders: Secondary | ICD-10-CM

## 2013-11-22 DIAGNOSIS — D239 Other benign neoplasm of skin, unspecified: Secondary | ICD-10-CM

## 2013-11-22 DIAGNOSIS — Z0000 Encounter for general adult medical examination without abnormal findings: Secondary | ICD-10-CM

## 2013-11-22 DIAGNOSIS — D229 Melanocytic nevi, unspecified: Secondary | ICD-10-CM | POA: Insufficient documentation

## 2013-11-22 LAB — COMPREHENSIVE METABOLIC PANEL
ALK PHOS: 48 U/L (ref 39–117)
ALT: 33 U/L (ref 0–35)
AST: 22 U/L (ref 0–37)
Albumin: 4.2 g/dL (ref 3.5–5.2)
BUN: 12 mg/dL (ref 6–23)
CHLORIDE: 105 meq/L (ref 96–112)
CO2: 27 mEq/L (ref 19–32)
Calcium: 9.2 mg/dL (ref 8.4–10.5)
Creatinine, Ser: 0.9 mg/dL (ref 0.4–1.2)
GFR: 77.51 mL/min (ref >60.00)
Glucose, Bld: 99 mg/dL (ref 70–99)
Potassium: 4.2 mEq/L (ref 3.5–5.1)
SODIUM: 139 meq/L (ref 135–145)
Total Bilirubin: 1.4 mg/dL — ABNORMAL HIGH (ref 0.3–1.2)
Total Protein: 7.5 g/dL (ref 6.0–8.3)

## 2013-11-22 LAB — CBC WITH DIFFERENTIAL/PLATELET
BASOS ABS: 0 10*3/uL (ref 0.0–0.1)
Basophils Relative: 0.3 % (ref 0.0–3.0)
EOS ABS: 0.2 10*3/uL (ref 0.0–0.7)
EOS PCT: 2.6 % (ref 0.0–5.0)
HEMATOCRIT: 43.8 % (ref 36.0–46.0)
HEMOGLOBIN: 14.6 g/dL (ref 12.0–15.0)
LYMPHS ABS: 1.8 10*3/uL (ref 0.7–4.0)
LYMPHS PCT: 25.2 % (ref 12.0–46.0)
MCHC: 33.2 g/dL (ref 30.0–36.0)
MCV: 88.5 fl (ref 78.0–100.0)
MONOS PCT: 6.5 % (ref 3.0–12.0)
Monocytes Absolute: 0.5 10*3/uL (ref 0.1–1.0)
NEUTROS ABS: 4.6 10*3/uL (ref 1.4–7.7)
NEUTROS PCT: 65.4 % (ref 43.0–77.0)
Platelets: 280 10*3/uL (ref 150.0–400.0)
RBC: 4.95 Mil/uL (ref 3.87–5.11)
RDW: 13.1 % (ref 11.5–14.6)
WBC: 7.1 10*3/uL (ref 4.5–10.5)

## 2013-11-22 LAB — LIPID PANEL
CHOL/HDL RATIO: 5
CHOLESTEROL: 200 mg/dL (ref 0–200)
HDL: 43.5 mg/dL (ref >39.00)
LDL Cholesterol: 132 mg/dL — ABNORMAL HIGH (ref 0–99)
Triglycerides: 125 mg/dL (ref 0.0–149.0)
VLDL: 25 mg/dL (ref 0.0–40.0)

## 2013-11-22 LAB — TSH: TSH: 2.04 u[IU]/mL (ref 0.35–5.50)

## 2013-11-22 NOTE — Progress Notes (Signed)
Pre-visit discussion using our clinic review tool. No additional management support is needed unless otherwise documented below in the visit note.  

## 2013-11-22 NOTE — Progress Notes (Signed)
Subjective:    Patient ID: Sharon Mckenzie, female    DOB: 08/17/1971, 43 y.o.   MRN: [id number]  HPI  Very pleasant 43 yo G0 here for CPX.  Last pap smear normal 09/09/2011.  No h/o abnormal pap smears.  She is sexually active with one partner only. No family history of breast, uterine, cervical or ovarian cancer. Denies any dysuria or vaginal discharge.   Due for routine lab work.  Had mammogram in 09/2013.  Had to have aspiration /biopsy of cyst- it was benign.  Wants follow up in 6 months.  Patient Active Problem List   Diagnosis Date Noted  . Routine general medical examination at a health care facility 09/13/2012   Past Medical History  Diagnosis Date  . Asthma   . Seasonal allergies    Past Surgical History  Procedure Laterality Date  . Cholecystectomy     History  Substance Use Topics  . Smoking status: Never Smoker   . Smokeless tobacco: Not on file  . Alcohol Use: 1 - 1.5 oz/week    2-3 drink(s) per week   Family History  Problem Relation Age of Onset  . Hyperlipidemia Mother   . Hypertension Mother    No Known Allergies Current Outpatient Prescriptions on File Prior to Visit  Medication Sig Dispense Refill  . levonorgestrel (MIRENA) 20 MCG/24HR IUD 1 each by Intrauterine route once.      . Multiple Vitamin (MULTIVITAMIN) tablet Take 1 tablet by mouth daily.       No current facility-administered medications on file prior to visit.   The PMH, PSH, Social History, Family History, Medications, and allergies have been reviewed in [facility], and have been updated if relevant.    Review of Systems See HPI Patient reports no  vision/ hearing changes,anorexia, weight change, fever ,adenopathy, persistant / recurrent hoarseness, swallowing issues, chest pain, edema,persistant / recurrent cough, hemoptysis, dyspnea(rest, exertional, paroxysmal nocturnal), gastrointestinal  bleeding (melena, rectal bleeding), abdominal pain, excessive heart burn, GU symptoms(dysuria,  hematuria, pyuria, voiding/incontinence  Issues) syncope, focal weakness, severe memory loss, concerning skin lesions, depression, anxiety, abnormal bruising/bleeding, major joint swelling, breast masses or abnormal vaginal bleeding.       Objective:   Physical Exam BP 118/70  Pulse 92  Temp(Src) 98.4 F (36.9 C) (Oral)  Ht 5\' 6"  (1.676 m)  Wt 215 lb 8 oz (97.75 kg)  BMI 34.80 kg/m2  SpO2 97%  General:  Well-developed,well-nourished,in no acute distress; alert,appropriate and cooperative throughout examination Head:  normocephalic and atraumatic.   Eyes:  vision grossly intact, pupils equal, pupils round, and pupils reactive to light.   Ears:  R ear normal and L ear normal.   Nose:  no external deformity.   Mouth:  good dentition.   Neck:  No deformities, masses, or tenderness noted. Breasts:  No mass, nodules, thickening, tenderness, bulging, retraction, inflamation, nipple discharge or skin changes noted.   Lungs:  Normal respiratory effort, chest expands symmetrically. Lungs are clear to auscultation, no crackles or wheezes. Heart:  Normal rate and regular rhythm. S1 and S2 normal without gallop, murmur, click, rub or other extra sounds. Abdomen:  Bowel sounds positive,abdomen soft and non-tender without masses, organomegaly or hernias noted. Msk:  No deformity or scoliosis noted of thoracic or lumbar spine.   Extremities:  No clubbing, cyanosis, edema, or deformity noted with normal full range of motion of all joints.   Neurologic:  alert & oriented X3 and gait normal.   Skin:  + multiple nevi Cervical  Nodes:  No lymphadenopathy noted Axillary Nodes:  No palpable lymphadenopathy Psych:  Cognition and judgment appear intact. Alert and cooperative with normal attention span and concentration. No apparent delusions, illusions, hallucinations     Assessment & Plan:

## 2013-11-22 NOTE — Assessment & Plan Note (Signed)
Reviewed preventive care protocols, scheduled due services, and updated immunizations Discussed nutrition, exercise, diet, and healthy lifestyle.  Orders Placed This Encounter  Procedures  . Comprehensive metabolic panel  . CBC with Differential  . Lipid panel  . TSH  . Ambulatory referral to Dermatology

## 2013-11-22 NOTE — Patient Instructions (Addendum)
It was great to see you. Please ask for 3D mammogram when you schedule your next mammogram.  We will call you with your lab results and you can view them online.

## 2013-11-22 NOTE — Assessment & Plan Note (Signed)
Refer to derm for routine surveillance.

## 2014-01-22 ENCOUNTER — Ambulatory Visit (INDEPENDENT_AMBULATORY_CARE_PROVIDER_SITE_OTHER): Payer: 59 | Admitting: Internal Medicine

## 2014-01-22 ENCOUNTER — Encounter: Payer: Self-pay | Admitting: Internal Medicine

## 2014-01-22 VITALS — BP 122/78 | HR 79 | Temp 98.7°F | Wt 224.5 lb

## 2014-01-22 DIAGNOSIS — N3090 Cystitis, unspecified without hematuria: Secondary | ICD-10-CM

## 2014-01-22 DIAGNOSIS — R300 Dysuria: Secondary | ICD-10-CM

## 2014-01-22 LAB — POCT URINALYSIS DIPSTICK
BILIRUBIN UA: NEGATIVE
GLUCOSE UA: NEGATIVE
Ketones, UA: NEGATIVE
Leukocytes, UA: NEGATIVE
Nitrite, UA: NEGATIVE
Protein, UA: NEGATIVE
UROBILINOGEN UA: 0.2
pH, UA: 7

## 2014-01-22 NOTE — Progress Notes (Signed)
Pre visit review using our clinic review tool, if applicable. No additional management support is needed unless otherwise documented below in the visit note. 

## 2014-01-22 NOTE — Progress Notes (Signed)
HPI  Pt presents to the clinic today with c/o dysuria, frequency and right flank pain. She reports this started 1 day ago. She denies fever, chills or body aches. She has not taken anything OTC.   Review of Systems  Past Medical History  Diagnosis Date  . Asthma   . Seasonal allergies     Family History  Problem Relation Age of Onset  . Hyperlipidemia Mother   . Hypertension Mother     History   Social History  . Marital Status: Married    Spouse Name: N/A    Number of Children: N/A  . Years of Education: N/A   Occupational History  . Not on file.   Social History Main Topics  . Smoking status: Never Smoker   . Smokeless tobacco: Not on file  . Alcohol Use: 1.0 - 1.5 oz/week    2-3 drink(s) per week  . Drug Use: No  . Sexual Activity: Not on file   Other Topics Concern  . Not on file   Social History Narrative   Married, no children.   Works for [employer].    No Known Allergies  Constitutional: Denies fever, malaise, fatigue, headache or abrupt weight changes.   GU: Pt reports urgency, frequency and pain with urination. Denies burning sensation, blood in urine, odor or discharge. Skin: Denies redness, rashes, lesions or ulcercations.   No other specific complaints in a complete review of systems (except as listed in HPI above).    Objective:   Physical Exam  BP 122/78  Pulse 79  Temp(Src) 98.7 F (37.1 C) (Oral)  Wt 224 lb 8 oz (101.833 kg)  SpO2 98% Wt Readings from Last 3 Encounters:  01/22/14 224 lb 8 oz (101.833 kg)  11/22/13 215 lb 8 oz (97.75 kg)  02/02/13 217 lb 12 oz (98.771 kg)    General: Appears her stated age, well developed, well nourished in NAD. Cardiovascular: Normal rate and rhythm. S1,S2 noted.  No murmur, rubs or gallops noted. No JVD or BLE edema. No carotid bruits noted. Pulmonary/Chest: Normal effort and positive vesicular breath sounds. No respiratory distress. No wheezes, rales or ronchi noted.  Abdomen: Soft  and nontender. Normal bowel sounds, no bruits noted. No distention or masses noted. Liver, spleen and kidneys non palpable. Tender to palpation over the bladder area. No CVA tenderness.      Assessment & Plan:   Urgency, Frequency, Dysuria, Flank pain secondary to cystitis:  Urinalysis: small blood otherwise normal Ok to take AZO OTC if needed Drink plenty of fluids  RTC as needed or if symptoms persist.

## 2014-01-22 NOTE — Patient Instructions (Addendum)

## 2014-01-23 ENCOUNTER — Ambulatory Visit: Payer: 59 | Admitting: Internal Medicine

## 2014-02-18 ENCOUNTER — Telehealth: Payer: Self-pay | Admitting: Family Medicine

## 2014-02-18 DIAGNOSIS — Z1231 Encounter for screening mammogram for malignant neoplasm of breast: Secondary | ICD-10-CM

## 2014-02-18 NOTE — Telephone Encounter (Signed)
Pt called and would like referral for 3-D mammogram to [place] per your recommendation 11/2013.  Best number to call pt is [phone]/ lt

## 2014-02-20 ENCOUNTER — Other Ambulatory Visit: Payer: Self-pay | Admitting: Family Medicine

## 2014-02-20 DIAGNOSIS — N6320 Unspecified lump in the left breast, unspecified quadrant: Secondary | ICD-10-CM

## 2014-03-13 ENCOUNTER — Encounter (INDEPENDENT_AMBULATORY_CARE_PROVIDER_SITE_OTHER): Payer: Self-pay

## 2014-03-13 ENCOUNTER — Other Ambulatory Visit: Payer: Self-pay | Admitting: Family Medicine

## 2014-03-13 ENCOUNTER — Ambulatory Visit
Admission: RE | Admit: 2014-03-13 | Discharge: 2014-03-13 | Disposition: A | Discharge status: Home / Self Care | Payer: 59 | Source: Ambulatory Visit | Attending: Family Medicine | Admitting: Family Medicine

## 2014-03-13 DIAGNOSIS — N6320 Unspecified lump in the left breast, unspecified quadrant: Secondary | ICD-10-CM

## 2014-09-16 ENCOUNTER — Ambulatory Visit
Admission: RE | Admit: 2014-09-16 | Discharge: 2014-09-16 | Disposition: A | Discharge status: Home / Self Care | Payer: 59 | Source: Ambulatory Visit | Attending: Internal Medicine | Admitting: Internal Medicine

## 2014-09-16 DIAGNOSIS — Z1239 Encounter for other screening for malignant neoplasm of breast: Secondary | ICD-10-CM

## 2014-09-17 ENCOUNTER — Other Ambulatory Visit: Payer: Self-pay | Admitting: Internal Medicine

## 2014-09-17 DIAGNOSIS — R928 Other abnormal and inconclusive findings on diagnostic imaging of breast: Secondary | ICD-10-CM

## 2014-09-19 ENCOUNTER — Other Ambulatory Visit (HOSPITAL_COMMUNITY)
Admission: RE | Admit: 2014-09-19 | Discharge: 2014-09-19 | Disposition: A | Discharge status: Home / Self Care | Payer: 59 | Source: Ambulatory Visit | Attending: Nurse Practitioner | Admitting: Nurse Practitioner

## 2014-09-19 ENCOUNTER — Other Ambulatory Visit: Payer: Self-pay | Admitting: Nurse Practitioner

## 2014-09-19 ENCOUNTER — Other Ambulatory Visit: Payer: Self-pay | Admitting: Internal Medicine

## 2014-09-19 DIAGNOSIS — R928 Other abnormal and inconclusive findings on diagnostic imaging of breast: Secondary | ICD-10-CM

## 2014-09-19 DIAGNOSIS — Z1151 Encounter for screening for human papillomavirus (HPV): Secondary | ICD-10-CM | POA: Insufficient documentation

## 2014-09-19 DIAGNOSIS — Z01411 Encounter for gynecological examination (general) (routine) with abnormal findings: Secondary | ICD-10-CM | POA: Diagnosis present

## 2014-09-19 DIAGNOSIS — Z113 Encounter for screening for infections with a predominantly sexual mode of transmission: Secondary | ICD-10-CM | POA: Insufficient documentation

## 2014-09-20 LAB — CYTOLOGY - PAP

## 2014-09-23 ENCOUNTER — Other Ambulatory Visit: Payer: Self-pay | Admitting: Internal Medicine

## 2014-09-23 DIAGNOSIS — R928 Other abnormal and inconclusive findings on diagnostic imaging of breast: Secondary | ICD-10-CM

## 2014-09-24 ENCOUNTER — Encounter: Payer: Self-pay | Admitting: Family Medicine

## 2014-09-24 ENCOUNTER — Ambulatory Visit (INDEPENDENT_AMBULATORY_CARE_PROVIDER_SITE_OTHER): Payer: 59 | Admitting: Family Medicine

## 2014-09-24 VITALS — BP 132/78 | HR 88 | Temp 98.1°F | Wt 235.5 lb

## 2014-09-24 DIAGNOSIS — L089 Local infection of the skin and subcutaneous tissue, unspecified: Secondary | ICD-10-CM | POA: Insufficient documentation

## 2014-09-24 DIAGNOSIS — L723 Sebaceous cyst: Secondary | ICD-10-CM

## 2014-09-24 MED ORDER — DOXYCYCLINE HYCLATE 100 MG PO TABS: 100.0000 mg | ORAL_TABLET | Freq: Two times a day (BID) | ORAL | Status: DC

## 2014-09-24 MED ORDER — FLUCONAZOLE 150 MG PO TABS: 150.0000 mg | ORAL_TABLET | Freq: Once | ORAL | Status: AC

## 2014-09-24 NOTE — Progress Notes (Signed)
   Subjective:   Patient ID: Sharon Mckenzie, female    DOB: 04-30-1971, 43 y.o.   MRN: [id number]  Sharon Mckenzie is a pleasant 43 y.o. year old female who presents to clinic today with Cyst  on 09/24/2014  HPI:  ? Breast cyst at base of LEFT  breast.  Had a mammogram on 09/16/14- at that time, had "a pimple" in the location that is bothering her now.  Mammogram was very painful in that area.  Mammogram showed possible abnormality on RIGHT breast and she is scheduled for follow up views on 10/08/14. (reviewed in Epic). No masses noted on left.  Day after mammogram, pimple became larger, more painful and red and is now the size of a quarter.  No fevers or chills.  Current Outpatient Prescriptions on File Prior to Visit  Medication Sig Dispense Refill  . levonorgestrel (MIRENA) 20 MCG/24HR IUD 1 each by Intrauterine route once.    . Multiple Vitamin (MULTIVITAMIN) tablet Take 1 tablet by mouth daily.     No current facility-administered medications on file prior to visit.    No Known Allergies  Past Medical History  Diagnosis Date  . Asthma   . Seasonal allergies     Past Surgical History  Procedure Laterality Date  . Cholecystectomy      Family History  Problem Relation Age of Onset  . Hyperlipidemia Mother   . Hypertension Mother     History   Social History  . Marital Status: Married    Spouse Name: N/A    Number of Children: N/A  . Years of Education: N/A   Occupational History  . Not on file.   Social History Main Topics  . Smoking status: Never Smoker   . Smokeless tobacco: Not on file  . Alcohol Use: 1.0 - 1.5 oz/week    2-3 drink(s) per week  . Drug Use: No  . Sexual Activity: Not on file   Other Topics Concern  . Not on file   Social History Narrative   Married, no children.   Works for [employer].   The PMH, [place], Social History, Family History, Medications, and allergies have been reviewed in [facility], and have been updated if  relevant.   Review of Systems  Constitutional: Negative.  Negative for fever, chills, appetite change and fatigue.  Respiratory: Negative.   Gastrointestinal: Negative.   Endocrine: Negative.   Hematological: Negative.   All other systems reviewed and are negative.      Objective:    BP 132/78 mmHg  Pulse 88  Temp(Src) 98.1 F (36.7 C) (Oral)  Wt 235 lb 8 oz (106.822 kg)  SpO2 97%   Physical Exam  Constitutional: She is oriented to person, place, and time. She appears well-developed and well-nourished. No distress.  HENT:  Head: Normocephalic.  Eyes: Conjunctivae are normal.  Neck: Normal range of motion.  Cardiovascular: Normal rate.   Pulmonary/Chest: Effort normal. She exhibits no bony tenderness. Right breast exhibits no inverted nipple. Left breast exhibits no inverted nipple.    Musculoskeletal: Normal range of motion.  Neurological: She is alert and oriented to person, place, and time.  Skin: Skin is warm and dry.  Psychiatric: She has a normal mood and affect. Her behavior is normal. Thought content normal.          Assessment & Plan:   Infected sebaceous cyst No Follow-up on file.

## 2014-09-24 NOTE — Patient Instructions (Signed)
Great to see you. Happy Holidays.  Take doxycyline as directed- 1 tablet twice daily for 10 days. Apply warm compresses. Call me by the end of the week with an update.  Call me sooner if you develop a fever or if symptoms worsen.

## 2014-09-24 NOTE — Assessment & Plan Note (Signed)
New- reassured by recent normal left breast mammogram. Will treat with doxycyline 100 mg twice daily x 10 days.  We chose not to treat with Bactrim since pt has never been on a sulfa drug and a little leary that she may have an allergy. Discussed red flag symptoms warranting urgent evaluation- see AVS. She will call me with update by the end of the week. If symptoms do not resolve, will refer to breast surg (or gen surg since not truly within breast) for removal. The patient indicates understanding of these issues and agrees with the plan.

## 2014-09-24 NOTE — Progress Notes (Signed)
Pre visit review using our clinic review tool, if applicable. No additional management support is needed unless otherwise documented below in the visit note. 

## 2014-10-02 ENCOUNTER — Other Ambulatory Visit: Payer: Self-pay

## 2014-10-02 ENCOUNTER — Other Ambulatory Visit: Payer: Self-pay | Admitting: Internal Medicine

## 2014-10-02 DIAGNOSIS — R928 Other abnormal and inconclusive findings on diagnostic imaging of breast: Secondary | ICD-10-CM

## 2014-10-08 ENCOUNTER — Ambulatory Visit
Admission: RE | Admit: 2014-10-08 | Discharge: 2014-10-08 | Disposition: A | Discharge status: Home / Self Care | Payer: 59 | Source: Ambulatory Visit | Attending: Internal Medicine | Admitting: Internal Medicine

## 2014-10-08 DIAGNOSIS — R928 Other abnormal and inconclusive findings on diagnostic imaging of breast: Secondary | ICD-10-CM

## 2015-01-22 ENCOUNTER — Encounter: Payer: Self-pay | Admitting: Family Medicine

## 2015-01-22 ENCOUNTER — Ambulatory Visit (INDEPENDENT_AMBULATORY_CARE_PROVIDER_SITE_OTHER): Payer: 59 | Admitting: Family Medicine

## 2015-01-22 VITALS — BP 114/60 | HR 83 | Temp 98.4°F | Ht 66.0 in | Wt 216.2 lb

## 2015-01-22 DIAGNOSIS — E669 Obesity, unspecified: Secondary | ICD-10-CM

## 2015-01-22 DIAGNOSIS — J029 Acute pharyngitis, unspecified: Secondary | ICD-10-CM | POA: Diagnosis not present

## 2015-01-22 DIAGNOSIS — Z0000 Encounter for general adult medical examination without abnormal findings: Secondary | ICD-10-CM

## 2015-01-22 DIAGNOSIS — L723 Sebaceous cyst: Secondary | ICD-10-CM

## 2015-01-22 DIAGNOSIS — L089 Local infection of the skin and subcutaneous tissue, unspecified: Secondary | ICD-10-CM

## 2015-01-22 LAB — CBC WITH DIFFERENTIAL/PLATELET
BASOS ABS: 0 10*3/uL (ref 0.0–0.1)
Basophils Relative: 0.5 % (ref 0.0–3.0)
Eosinophils Absolute: 0.2 10*3/uL (ref 0.0–0.7)
Eosinophils Relative: 2.2 % (ref 0.0–5.0)
HEMATOCRIT: 45.1 % (ref 36.0–46.0)
HEMOGLOBIN: 15.4 g/dL — AB (ref 12.0–15.0)
LYMPHS ABS: 2.2 10*3/uL (ref 0.7–4.0)
Lymphocytes Relative: 26.8 % (ref 12.0–46.0)
MCHC: 34 g/dL (ref 30.0–36.0)
MCV: 87.2 fl (ref 78.0–100.0)
Monocytes Absolute: 0.5 10*3/uL (ref 0.1–1.0)
Monocytes Relative: 6 % (ref 3.0–12.0)
NEUTROS ABS: 5.3 10*3/uL (ref 1.4–7.7)
Neutrophils Relative %: 64.5 % (ref 43.0–77.0)
PLATELETS: 268 10*3/uL (ref 150.0–400.0)
RBC: 5.18 Mil/uL — ABNORMAL HIGH (ref 3.87–5.11)
RDW: 13.5 % (ref 11.5–15.5)
WBC: 8.2 10*3/uL (ref 4.0–10.5)

## 2015-01-22 LAB — COMPREHENSIVE METABOLIC PANEL
ALT: 14 U/L (ref 0–35)
AST: 15 U/L (ref 0–37)
Albumin: 4.2 g/dL (ref 3.5–5.2)
Alkaline Phosphatase: 54 U/L (ref 39–117)
BUN: 10 mg/dL (ref 6–23)
CALCIUM: 9.5 mg/dL (ref 8.4–10.5)
CO2: 25 meq/L (ref 19–32)
Chloride: 105 mEq/L (ref 96–112)
Creatinine, Ser: 0.85 mg/dL (ref 0.40–1.20)
GFR: 78.11 mL/min (ref >60.00)
Glucose, Bld: 98 mg/dL (ref 70–99)
Potassium: 4.6 mEq/L (ref 3.5–5.1)
Sodium: 136 mEq/L (ref 135–145)
TOTAL PROTEIN: 7.3 g/dL (ref 6.0–8.3)
Total Bilirubin: 0.8 mg/dL (ref 0.2–1.2)

## 2015-01-22 LAB — LIPID PANEL
CHOL/HDL RATIO: 5
Cholesterol: 205 mg/dL — ABNORMAL HIGH (ref 0–200)
HDL: 44 mg/dL (ref >39.00)
LDL Cholesterol: 139 mg/dL — ABNORMAL HIGH (ref 0–99)
NonHDL: 161
Triglycerides: 111 mg/dL (ref 0.0–149.0)
VLDL: 22.2 mg/dL (ref 0.0–40.0)

## 2015-01-22 LAB — TSH: TSH: 1.97 u[IU]/mL (ref 0.35–4.50)

## 2015-01-22 NOTE — Assessment & Plan Note (Signed)
Consistent with PND/allergic rhinitis. Advised antihistamine with decongestant, along with nasal steroid. See AVS. Call or return to clinic prn if these symptoms worsen or fail to improve as anticipated. The patient indicates understanding of these issues and agrees with the plan.

## 2015-01-22 NOTE — Assessment & Plan Note (Signed)
Reviewed preventive care protocols, scheduled due services, and updated immunizations Discussed nutrition, exercise, diet, and healthy lifestyle.  Orders Placed This Encounter  Procedures  . CBC with Differential/Platelet  . Comprehensive metabolic panel  . Lipid panel  . TSH     

## 2015-01-22 NOTE — Progress Notes (Signed)
Subjective:    Patient ID: Sharon Mckenzie, female    DOB: 1971/07/12, 44 y.o.   MRN: [id number]  Sore Throat  This is a new problem. The current episode started 1 to 4 weeks ago. The problem has been waxing and waning. Neither side of throat is experiencing more pain than the other. There has been no fever. The pain is at a severity of 3/10. The pain is mild. Associated symptoms include congestion. Pertinent negatives include no abdominal pain, coughing, diarrhea, drooling, ear discharge, ear pain, headaches, hoarse voice, plugged ear sensation, neck pain, shortness of breath, stridor, swollen glands, trouble swallowing or vomiting. She has had no exposure to strep. She has tried NSAIDs for the symptoms. The treatment provided moderate relief.    Very pleasant 44 yo G0 here for CPX with complaint of sore throat.  Last pap smear normal 112/15/15 ([facility]).  No h/o abnormal pap smears.  She is sexually active with one partner only. No family history of breast, uterine, cervical or ovarian cancer. Denies any dysuria or vaginal discharge. Has IUD.  Mammogram 09/17/14.  Due for routine lab work.  Sebaceous cyst under her left breast has been bothering her lately.  It is on her bra/wire line.  Does not seem infected like it was last year.  Not painful, no drainage.  Lab Results  Component Value Date   CHOL 200 11/22/2013   HDL 43.50 11/22/2013   LDLCALC 132* 11/22/2013   LDLDIRECT 164.6 09/13/2012   TRIG 125.0 11/22/2013   CHOLHDL 5 11/22/2013   Lab Results  Component Value Date   CREATININE 0.9 11/22/2013   Lab Results  Component Value Date   TSH 2.04 11/22/2013   Lab Results  Component Value Date   WBC 7.1 11/22/2013   HGB 14.6 11/22/2013   HCT 43.8 11/22/2013   MCV 88.5 11/22/2013   PLT 280.0 11/22/2013   Losing weight- going to the gym 3-4 days per week.  Wt Readings from Last 3 Encounters:  01/22/15 216 lb 4 oz (98.09 kg)  09/24/14 235 lb 8 oz (106.822 kg)   01/22/14 224 lb 8 oz (101.833 kg)     Patient Active Problem List   Diagnosis Date Noted  . Multiple nevi 11/22/2013   Past Medical History  Diagnosis Date  . Asthma   . Seasonal allergies    Past Surgical History  Procedure Laterality Date  . Cholecystectomy     History  Substance Use Topics  . Smoking status: Never Smoker   . Smokeless tobacco: Not on file  . Alcohol Use: 1.0 - 1.5 oz/week    2-3 drink(s) per week   Family History  Problem Relation Age of Onset  . Hyperlipidemia Mother   . Hypertension Mother    No Known Allergies Current Outpatient Prescriptions on File Prior to Visit  Medication Sig Dispense Refill  . levonorgestrel (MIRENA) 20 MCG/24HR IUD 1 each by Intrauterine route once.    . Multiple Vitamin (MULTIVITAMIN) tablet Take 1 tablet by mouth daily.     No current facility-administered medications on file prior to visit.   The PMH, PSH, Social History, Family History, Medications, and allergies have been reviewed in [facility], and have been updated if relevant.    Review of Systems  Constitutional: Negative.   HENT: Positive for congestion. Negative for drooling, ear discharge, ear pain, hoarse voice and trouble swallowing.   Eyes: Negative.   Respiratory: Negative for cough, shortness of breath and stridor.   Gastrointestinal: Negative  for vomiting, abdominal pain and diarrhea.  Endocrine: Negative.   Genitourinary: Negative.   Musculoskeletal: Negative.  Negative for neck pain.  Skin: Negative.   Allergic/Immunologic: Negative.   Neurological: Negative.  Negative for headaches.  Hematological: Negative.   Psychiatric/Behavioral: Negative.        Objective:   Physical Exam BP 114/60 mmHg  Pulse 83  Temp(Src) 98.4 F (36.9 C) (Oral)  Ht 5\' 6"  (1.676 m)  Wt 216 lb 4 oz (98.09 kg)  BMI 34.92 kg/m2  SpO2 98%  General:  Well-developed,well-nourished,in no acute distress; alert,appropriate and cooperative throughout examination Head:   normocephalic and atraumatic.   Eyes:  vision grossly intact, pupils equal, pupils round, and pupils reactive to light.   Ears:  R ear normal and L ear normal.   Nose:  no external deformity.   Mouth:  good dentition.   +PND Neck:  No deformities, masses, or tenderness noted. Breasts:  No mass, nodules, thickening, tenderness, bulging, retraction, inflamation, nipple discharge or skin changes noted.   Lungs:  Normal respiratory effort, chest expands symmetrically. Lungs are clear to auscultation, no crackles or wheezes. Heart:  Normal rate and regular rhythm. S1 and S2 normal without gallop, murmur, click, rub or other extra sounds. Abdomen:  Bowel sounds positive,abdomen soft and non-tender without masses, organomegaly or hernias noted. Msk:  No deformity or scoliosis noted of thoracic or lumbar spine.   Extremities:  No clubbing, cyanosis, edema, or deformity noted with normal full range of motion of all joints.   Neurologic:  alert & oriented X3 and gait normal.   Skin:  Small, sebaceous cyst under left breast Cervical Nodes:  No lymphadenopathy noted Axillary Nodes:  No palpable lymphadenopathy Psych:  Cognition and judgment appear intact. Alert and cooperative with normal attention span and concentration. No apparent delusions, illusions, hallucinations     Assessment & Plan:

## 2015-01-22 NOTE — Assessment & Plan Note (Signed)
Improving with diet and exercise. Encouraged her to continue her healthy lifestyle and congratulated her on her success.

## 2015-01-22 NOTE — Patient Instructions (Signed)
Good to see you. We will call you with your lab results and you can view them online.  Please call your dermatologist to set up an appointment.  Try flonase and allegra D for your sore throat/drainage.  Keep me updated with your symptoms.

## 2015-01-22 NOTE — Assessment & Plan Note (Signed)
No longer infected but given location, I do suggest removal as it will likely continuously get irritated and inflamed.  She will call her dermatologist to set up an appointment.

## 2015-01-22 NOTE — Progress Notes (Signed)
Pre visit review using our clinic review tool, if applicable. No additional management support is needed unless otherwise documented below in the visit note. 

## 2015-01-23 ENCOUNTER — Encounter: Payer: Self-pay | Admitting: *Deleted

## 2015-07-15 ENCOUNTER — Encounter: Payer: Self-pay | Admitting: Sports Medicine

## 2015-07-15 ENCOUNTER — Ambulatory Visit (INDEPENDENT_AMBULATORY_CARE_PROVIDER_SITE_OTHER): Payer: 59

## 2015-07-15 ENCOUNTER — Ambulatory Visit (INDEPENDENT_AMBULATORY_CARE_PROVIDER_SITE_OTHER): Payer: 59 | Admitting: Sports Medicine

## 2015-07-15 DIAGNOSIS — G5782 Other specified mononeuropathies of left lower limb: Secondary | ICD-10-CM

## 2015-07-15 DIAGNOSIS — M79672 Pain in left foot: Secondary | ICD-10-CM

## 2015-07-15 DIAGNOSIS — G5762 Lesion of plantar nerve, left lower limb: Secondary | ICD-10-CM

## 2015-07-15 NOTE — Progress Notes (Signed)
   Subjective:    Patient ID: Sharon Mckenzie, female    DOB: 1971-08-13, 44 y.o.   MRN: [id number]  HPI Sharon Mckenzie, 44 y/o female patient presents to office for Left foot pain. Patient reports that pain began about 1 month ago and gradually has become more noticeable. Reports stiff feeling in 3rd and 4th toes. Patient denies any injury or change in activity. Pain is worse in closed toe or narrow shoes and with compression to site. Patient denies tingling or numbness to toes but definitely admits an odd sensation or stiffness of the left 3rd and 4th toes only. Patient ranks discomfort 3/10. Denies any other pedal concerns at this time.   Patient Active Problem List   Diagnosis Date Noted  . Acute pharyngitis 01/22/2015  . Obesity 01/22/2015  . Sebaceous cyst 01/22/2015  . 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 levonorgestrel (MIRENA) 20 MCG/24HR IUD 1 each by Intrauterine route once.    . Multiple Vitamin (MULTIVITAMIN) tablet Take 1 tablet by mouth daily.     No current facility-administered medications on file prior to visit.    No Known Allergies     Review of Systems  All other systems reviewed and are negative.      Objective:   Physical Exam  Objective:  General: Well developed, nourished, in no acute distress, alert and oriented x3   Dermatological: Skin is warm, dry and supple bilateral. Nails x 10 are well manicured; remaining integument appears unremarkable at this time. There are no open sores, no preulcerative lesions, no rash or signs of infection present.  Vascular: Dorsalis Pedis artery and Posterior Tibial artery pedal pulses are 2/4 bilateral with immedate capillary fill time. Pedal hair growth present. No varicosities and no lower extremity edema present bilateral.   Neruologic: Grossly intact to all pedal sites via light touch bilateral. Pain in 3rd interspace with  palpable click noted left foot.  Musculoskeletal: No gross boney pedal deformities bilateral. No pain, crepitus, or limitation noted with foot and ankle range of motion bilateral. Muscular strength 5/5 in all groups tested bilateral.  Gait: Unassisted, Nonantalgic.   Xrays: Left foot, 3 views- Unremarkable     Assessment & Plan:   Problem List Items Addressed This Visit    None    Visit Diagnoses    Left foot pain    -  Primary    Relevant Orders    DG Foot Complete Left    Neuroma digital nerve, left        3rd interspace      -Complete examination performed -Xrays reviewed -Treatment options discussed - Patient refused injection today in left 3rd webspace -Dispensed metatarsal pad with explanation of use; explained to patient that if this works well will consider making orthotic that helps to offload metatarsals and neuroma site on left foot -Advise patient to wear good supportive shoes -Patient to return to office as needed or sooner if symptoms worsen.  Landis Martins, DPM

## 2015-08-05 ENCOUNTER — Ambulatory Visit: Payer: 59 | Admitting: Family Medicine

## 2015-10-07 ENCOUNTER — Other Ambulatory Visit: Payer: Self-pay

## 2015-10-07 DIAGNOSIS — Z1231 Encounter for screening mammogram for malignant neoplasm of breast: Secondary | ICD-10-CM

## 2015-10-24 ENCOUNTER — Ambulatory Visit
Admission: RE | Admit: 2015-10-24 | Discharge: 2015-10-24 | Disposition: A | Discharge status: Home / Self Care | Payer: 59 | Source: Ambulatory Visit

## 2015-10-24 DIAGNOSIS — Z1231 Encounter for screening mammogram for malignant neoplasm of breast: Secondary | ICD-10-CM

## 2016-09-17 ENCOUNTER — Other Ambulatory Visit: Payer: Self-pay | Admitting: Family Medicine

## 2016-09-17 DIAGNOSIS — Z1231 Encounter for screening mammogram for malignant neoplasm of breast: Secondary | ICD-10-CM

## 2016-10-25 ENCOUNTER — Ambulatory Visit: Payer: 59

## 2016-10-25 ENCOUNTER — Ambulatory Visit
Admission: RE | Admit: 2016-10-25 | Discharge: 2016-10-25 | Disposition: A | Discharge status: Home / Self Care | Payer: 59 | Source: Ambulatory Visit | Attending: Family Medicine | Admitting: Family Medicine

## 2016-10-25 DIAGNOSIS — Z1231 Encounter for screening mammogram for malignant neoplasm of breast: Secondary | ICD-10-CM

## 2017-08-02 DIAGNOSIS — Z23 Encounter for immunization: Secondary | ICD-10-CM | POA: Diagnosis not present

## 2017-09-16 ENCOUNTER — Other Ambulatory Visit: Payer: Self-pay | Admitting: Family Medicine

## 2017-09-16 DIAGNOSIS — Z1231 Encounter for screening mammogram for malignant neoplasm of breast: Secondary | ICD-10-CM

## 2017-10-13 ENCOUNTER — Other Ambulatory Visit: Payer: Self-pay | Admitting: Nurse Practitioner

## 2017-10-13 ENCOUNTER — Other Ambulatory Visit (HOSPITAL_COMMUNITY)
Admission: RE | Admit: 2017-10-13 | Discharge: 2017-10-13 | Disposition: A | Discharge status: Home / Self Care | Payer: 59 | Source: Ambulatory Visit | Attending: Nurse Practitioner | Admitting: Nurse Practitioner

## 2017-10-13 DIAGNOSIS — Z01419 Encounter for gynecological examination (general) (routine) without abnormal findings: Secondary | ICD-10-CM | POA: Diagnosis not present

## 2017-10-13 DIAGNOSIS — Z124 Encounter for screening for malignant neoplasm of cervix: Secondary | ICD-10-CM | POA: Insufficient documentation

## 2017-10-17 LAB — CYTOLOGY - PAP
DIAGNOSIS: NEGATIVE
Diagnosis: REACTIVE
HPV (WINDOPATH): NOT DETECTED

## 2017-10-31 ENCOUNTER — Encounter: Payer: Self-pay | Admitting: Family Medicine

## 2017-10-31 ENCOUNTER — Ambulatory Visit: Payer: 59

## 2017-10-31 ENCOUNTER — Ambulatory Visit (INDEPENDENT_AMBULATORY_CARE_PROVIDER_SITE_OTHER): Payer: 59 | Admitting: Family Medicine

## 2017-10-31 DIAGNOSIS — L089 Local infection of the skin and subcutaneous tissue, unspecified: Secondary | ICD-10-CM

## 2017-10-31 DIAGNOSIS — L723 Sebaceous cyst: Secondary | ICD-10-CM | POA: Diagnosis not present

## 2017-10-31 MED ORDER — DOXYCYCLINE HYCLATE 100 MG PO TABS: 100.0000 mg | ORAL_TABLET | Freq: Two times a day (BID) | ORAL | 0 refills | Status: DC

## 2017-10-31 MED ORDER — FLUCONAZOLE 150 MG PO TABS: 150.0000 mg | ORAL_TABLET | Freq: Once | ORAL | 0 refills | Status: AC

## 2017-10-31 NOTE — Progress Notes (Signed)
Subjective:   Patient ID: Sharon Mckenzie, female    DOB: Dec 16, 1970, 47 y.o.   MRN: [id number]  Sharon Mckenzie is a pleasant 47 y.o. year old female who presents to clinic today with Cyst (Patient is here today C/O a cyst under her left breast.  States it was originally evaluated 5-6 years ago.  It became bothersome on Friday hs.  It is painful, swollen, and is red.  Tried warm compresses but it is still very irritated and painful.)  on 10/31/2017  HPI:  Cyst under left breast- originally evaluated 4 years ago.  Had mammogram at that time, benign. Consistent with infected sebaceous cyst. Treated with abx and it resolved until 3 days ago.  Became red, painful and swollen 3 days ago. She has been exercising more.  ? If her sports bra irritated the area.  Current Outpatient Medications on File Prior to Visit  Medication Sig Dispense Refill  . levonorgestrel (MIRENA) 20 MCG/24HR IUD 1 each by Intrauterine route once.    . Multiple Vitamin (MULTIVITAMIN) tablet Take 1 tablet by mouth daily.     No current facility-administered medications on file prior to visit.     No Known Allergies  Past Medical History:  Diagnosis Date  . Asthma   . Seasonal allergies     Past Surgical History:  Procedure Laterality Date  . CHOLECYSTECTOMY      Family History  Problem Relation Age of Onset  . Hyperlipidemia Mother   . Hypertension Mother     Social History   Socioeconomic History  . Marital status: Married    Spouse name: Not on file  . Number of children: Not on file  . Years of education: Not on file  . Highest education level: Not on file  Social Needs  . Financial resource strain: Not on file  . Food insecurity - worry: Not on file  . Food insecurity - inability: Not on file  . Transportation needs - medical: Not on file  . Transportation needs - non-medical: Not on file  Occupational History  . Not on file  Tobacco Use  . Smoking status: Never Smoker  . Smokeless  tobacco: Never Used  Substance and Sexual Activity  . Alcohol use: Yes    Alcohol/week: 1.0 - 1.5 oz    Types: 2 - 3 Standard drinks or equivalent per week  . Drug use: No  . Sexual activity: Not on file  Other Topics Concern  . Not on file  Social History Narrative   Married, no children.   Works for [employer].   The PMH, Bricelyn, Social History, Family History, Medications, and allergies have been reviewed in [facility], and have been updated if relevant.   Review of Systems  Constitutional: Negative.   HENT: Negative.   Cardiovascular: Negative.   Gastrointestinal: Negative.   Skin: Positive for wound.  All other systems reviewed and are negative.      Objective:    BP 114/86 (BP Location: Left Arm, Patient Position: Sitting, Cuff Size: Normal)   Pulse 83   Temp 99 F (37.2 C) (Oral)   Ht 5\' 6"  (1.676 m)   Wt 227 lb (103 kg)   SpO2 98%   BMI 36.64 kg/m    Physical Exam  Constitutional: She is oriented to person, place, and time. She appears well-developed and well-nourished. No distress.  HENT:  Head: Normocephalic and atraumatic.  Eyes: Conjunctivae are normal.  Cardiovascular: Normal rate.  Pulmonary/Chest: Effort normal.  Musculoskeletal: Normal range of motion.  Neurological: She is alert and oriented to person, place, and time. No cranial nerve deficit.  Skin: She is not diaphoretic.     Psychiatric: She has a normal mood and affect. Her behavior is normal. Judgment and thought content normal.  Nursing note and vitals reviewed.         Assessment & Plan:   No diagnosis found. No Follow-up on file.

## 2017-10-31 NOTE — Patient Instructions (Signed)
Epidermal Cyst An epidermal cyst is sometimes called an epidermal inclusion cyst or an infundibular cyst. It is a sac made of skin tissue. The sac contains a substance called keratin. Keratin is a protein that is normally secreted through the hair follicles. When keratin becomes trapped in the top layer of skin (epidermis), it can form an epidermal cyst. Epidermal cysts are usually found on the face, neck, trunk, and genitals. These cysts are usually harmless (benign), and they may not cause symptoms unless they become infected. It is important not to pop epidermal cysts yourself. What are the causes? This condition may be caused by:  A blocked hair follicle.  A hair that curls and re-enters the skin instead of growing straight out of the skin (ingrown hair).  A blocked pore.  Irritated skin.  An injury to the skin.  Certain conditions that are passed along from parent to child (inherited).  Human papillomavirus (HPV).  What increases the risk? The following factors may make you more likely to develop an epidermal cyst:  Having acne.  Being overweight.  Wearing tight clothing.  What are the signs or symptoms? The only symptom of this condition may be a small, painless lump underneath the skin. When an epidermal cyst becomes infected, symptoms may include:  Redness.  Inflammation.  Tenderness.  Warmth.  Fever.  Keratin draining from the cyst. Keratin may look like a grayish-white, bad-smelling substance.  Pus draining from the cyst.  How is this diagnosed? This condition is diagnosed with a physical exam. In some cases, you may have a sample of tissue (biopsy) taken from your cyst to be examined under a microscope or tested for bacteria. You may be referred to a health care provider who specializes in skin care (dermatologist). How is this treated? In many cases, epidermal cysts go away on their own without treatment. If a cyst becomes infected, treatment may  include:  Opening and draining the cyst. After draining, minor surgery to remove the rest of the cyst may be done.  Antibiotic medicine to help prevent infection.  Injections of medicines (steroids) that help to reduce inflammation.  Surgery to remove the cyst. Surgery may be done if: ? The cyst becomes large. ? The cyst bothers you. ? There is a chance that the cyst could turn into cancer.  Follow these instructions at home:  Take over-the-counter and prescription medicines only as told by your health care provider.  If you were prescribed an antibiotic, use it as told by your health care provider. Do not stop using the antibiotic even if you start to feel better.  Keep the area around your cyst clean and dry.  Wear loose, dry clothing.  Do not try to pop your cyst.  Avoid touching your cyst.  Check your cyst every day for signs of infection.  Keep all follow-up visits as told by your health care provider. This is important. How is this prevented?  Wear clean, dry, clothing.  Avoid wearing tight clothing.  Keep your skin clean and dry. Shower or take baths every day.  Wash your body with a benzoyl peroxide wash when you shower or bathe. Contact a health care provider if:  Your cyst develops symptoms of infection.  Your condition is not improving or is getting worse.  You develop a cyst that looks different from other cysts you have had.  You have a fever. Get help right away if:  Redness spreads from the cyst into the surrounding area. This information is   not intended to replace advice given to you by your health care provider. Make sure you discuss any questions you have with your health care provider. Document Released: 08/28/2004 Document Revised: 05/26/2016 Document Reviewed: 07/30/2015 Elsevier Interactive Patient Education  2018 [employer]vier Inc.  

## 2017-10-31 NOTE — Assessment & Plan Note (Signed)
Deteriorated- treat with 10 day course of doxycyline 100 mg twice daily. Once infection has resolved, discussed going to see gen surgery to get cyst removed. The patient indicates understanding of these issues and agrees with the plan.

## 2017-11-08 ENCOUNTER — Ambulatory Visit: Payer: 59 | Admitting: Family Medicine

## 2017-12-28 ENCOUNTER — Ambulatory Visit
Admission: RE | Admit: 2017-12-28 | Discharge: 2017-12-28 | Disposition: A | Discharge status: Home / Self Care | Payer: 59 | Source: Ambulatory Visit | Attending: Family Medicine | Admitting: Family Medicine

## 2017-12-28 DIAGNOSIS — Z1231 Encounter for screening mammogram for malignant neoplasm of breast: Secondary | ICD-10-CM

## 2018-03-23 ENCOUNTER — Encounter: Payer: Self-pay | Admitting: Family Medicine

## 2018-03-23 ENCOUNTER — Ambulatory Visit: Payer: 59 | Admitting: Family Medicine

## 2018-03-23 DIAGNOSIS — M79671 Pain in right foot: Secondary | ICD-10-CM | POA: Insufficient documentation

## 2018-03-23 NOTE — Assessment & Plan Note (Signed)
She experienced this ongoing pain after she increased her exercise regimen.  It would suggest that she has a stress reaction as opposed to her fracture as nothing was appreciated on ultrasound today.  Possible for neuroma but presentation is less likely for that. -Placed in a postop shoe today. -Provided Pennsaid and samples -Follow-up in 2 weeks.  Will rescan at that time. -Encouraged to take vitamin D.

## 2018-03-23 NOTE — Progress Notes (Signed)
Sharon Mckenzie - 47 y.o. female MRN [id number]  Date of birth: 1971/07/04  SUBJECTIVE:  Including CC & ROS.  Chief Complaint  Patient presents with  . Right foot pain    Sharon Mckenzie is a 47 y.o. female that is presenting with right foot pain. Ongoing for six weeks. She was exercising six days a week in cardio classes. She noticed the pain the day after increasing her exercise. Pain is located in between her third and fourth metatarsal. Pain is mild to severe when walking and standing. She feels like her toes are stiff has no flexibility. Denies swelling. Denies injury or surgeries. She has been applying Bengay with no improvement. Denies any history of stress fracture. Pain is worse at the end of the day.    Review of Systems  Constitutional: Negative for fever.  HENT: Negative for congestion.   Respiratory: Negative for cough.   Cardiovascular: Negative for chest pain.  Gastrointestinal: Negative for abdominal pain.  Musculoskeletal: Positive for gait problem.  Skin: Negative for color change.  Neurological: Negative for weakness.  Hematological: Negative for adenopathy.  Psychiatric/Behavioral: Negative for agitation.    HISTORY: Past Medical, Surgical, Social, and Family History Reviewed & Updated per EMR.   Pertinent Historical Findings include:  Past Medical History:  Diagnosis Date  . Asthma   . Seasonal allergies     Past Surgical History:  Procedure Laterality Date  . CHOLECYSTECTOMY      No Known Allergies  Family History  Problem Relation Age of Onset  . Hyperlipidemia Mother   . Hypertension Mother      Social History   Socioeconomic History  . Marital status: Married    Spouse name: Not on file  . Number of children: Not on file  . Years of education: Not on file  . Highest education level: Not on file  Occupational History  . Not on file  Social Needs  . Financial resource strain: Not on file  . Food insecurity:    Worry: Not on file   Inability: Not on file  . Transportation needs:    Medical: Not on file    Non-medical: Not on file  Tobacco Use  . Smoking status: Never Smoker  . Smokeless tobacco: Never Used  Substance and Sexual Activity  . Alcohol use: Yes    Alcohol/week: 1.0 - 1.5 oz    Types: 2 - 3 Standard drinks or equivalent per week  . Drug use: No  . Sexual activity: Not on file  Lifestyle  . Physical activity:    Days per week: Not on file    Minutes per session: Not on file  . Stress: Not on file  Relationships  . Social connections:    Talks on phone: Not on file    Gets together: Not on file    Attends religious service: Not on file    Active member of club or organization: Not on file    Attends meetings of clubs or organizations: Not on file    Relationship status: Not on file  . Intimate partner violence:    Fear of current or ex partner: Not on file    Emotionally abused: Not on file    Physically abused: Not on file    Forced sexual activity: Not on file  Other Topics Concern  . Not on file  Social History Narrative   Married, no children.   Works for [employer]:  VS:  BP 126/68 (BP Location: Left Arm, Patient Position: Sitting, Cuff Size: Normal)   Pulse 68   Temp 98.9 F (37.2 C) (Oral)   Ht 5\' 6"  (1.676 m)   Wt 203 lb (92.1 kg)   SpO2 99%   BMI 32.77 kg/m  Physical Exam Gen: NAD, alert, cooperative with exam, well-appearing ENT: normal lips, normal nasal mucosa,  Eye: normal EOM, normal conjunctiva and lids CV:  no edema, +2 pedal pulses   Resp: no accessory muscle use, non-labored,  GI: no masses or tenderness, no hernia  Skin: no rashes, no areas of induration  Neuro: normal tone, normal sensation to touch Psych:  normal insight, alert and oriented MSK:  Right foot:  Normal flexion and extension of the phalanges. No abnormal callus or ulcer formation on the plantar aspect. Normal-appearing longitudinal arch. No significant widening  of the transverse arch in the forefoot. Some tenderness to palpation between the interdigital space between the third and fourth metatarsal. No clicking sensation upon compression. Neurovascular intact  Limited ultrasound: Right foot:  No disruption of the cortex of the third or the fourth metatarsal. No significant enlargement of the nerve to suggest a neuroma. Normal-appearing joint space of the MTP joint of the third and the fourth.   Summary: Normal exam  Ultrasound and interpretation by Clearance Coots, MD        ASSESSMENT & PLAN:   Right foot pain She experienced this ongoing pain after she increased her exercise regimen.  It would suggest that she has a stress reaction as opposed to her fracture as nothing was appreciated on ultrasound today.  Possible for neuroma but presentation is less likely for that. -Placed in a postop shoe today. -Provided Pennsaid and samples -Follow-up in 2 weeks.  Will rescan at that time. -Encouraged to take vitamin D.

## 2018-03-23 NOTE — Patient Instructions (Signed)
Nice to meet you  Please use the boot  Please try ice on your foot  Please follow up with me in 2 weeks.

## 2018-04-04 NOTE — Progress Notes (Signed)
Sharon Mckenzie - 47 y.o. female MRN [id number]  Date of birth: 03/02/1971  SUBJECTIVE:  Including CC & ROS.  Chief Complaint  Patient presents with  . Follow-up    Sharon Mckenzie is a 47 y.o. female that is here today for right foot pain follow up. She has been wearing post op shoe daily. Her third toe has been locking up. She has been taking Vitamin D. The pain is worse at the end of the day. Pain is located on the plantar aspect of the 3rd PIP joint. Feels like she can't flex her 3rd toe like she can her other toes.      Review of Systems  Constitutional: Negative for fever.  HENT: Negative for congestion.   Respiratory: Negative for cough.   Cardiovascular: Negative for chest pain.  Gastrointestinal: Negative for abdominal pain.  Musculoskeletal: Positive for gait problem.    HISTORY: Past Medical, Surgical, Social, and Family History Reviewed & Updated per EMR.   Pertinent Historical Findings include:  Past Medical History:  Diagnosis Date  . Asthma   . Seasonal allergies     Past Surgical History:  Procedure Laterality Date  . CHOLECYSTECTOMY      No Known Allergies  Family History  Problem Relation Age of Onset  . Hyperlipidemia Mother   . Hypertension Mother      Social History   Socioeconomic History  . Marital status: Married    Spouse name: Not on file  . Number of children: Not on file  . Years of education: Not on file  . Highest education level: Not on file  Occupational History  . Not on file  Social Needs  . Financial resource strain: Not on file  . Food insecurity:    Worry: Not on file    Inability: Not on file  . Transportation needs:    Medical: Not on file    Non-medical: Not on file  Tobacco Use  . Smoking status: Never Smoker  . Smokeless tobacco: Never Used  Substance and Sexual Activity  . Alcohol use: Yes    Alcohol/week: 1.2 - 1.8 oz    Types: 2 - 3 Standard drinks or equivalent per week  . Drug use: No  . Sexual  activity: Not on file  Lifestyle  . Physical activity:    Days per week: Not on file    Minutes per session: Not on file  . Stress: Not on file  Relationships  . Social connections:    Talks on phone: Not on file    Gets together: Not on file    Attends religious service: Not on file    Active member of club or organization: Not on file    Attends meetings of clubs or organizations: Not on file    Relationship status: Not on file  . Intimate partner violence:    Fear of current or ex partner: Not on file    Emotionally abused: Not on file    Physically abused: Not on file    Forced sexual activity: Not on file  Other Topics Concern  . Not on file  Social History Narrative   Married, no children.   Works for [employer]:  VS: BP 132/74 (BP Location: Left Arm, Patient Position: Sitting, Cuff Size: Normal)   Pulse 68   Ht 5\' 6"  (1.676 m)   Wt 205 lb (93 kg)   BMI 33.09 kg/m  Physical Exam Gen: NAD, alert,  cooperative with exam, well-appearing ENT: normal lips, normal nasal mucosa,  Eye: normal EOM, normal conjunctiva and lids CV:  no edema, +2 pedal pulses   Resp: no accessory muscle use, non-labored,   Skin: no rashes, no areas of induration  Neuro: normal tone, normal sensation to touch Psych:  normal insight, alert and oriented MSK:  Right foot:  TTp on plantar aspect of the 3rd PIP joint  Normal flexion and extension of other toes No clicking present  No TTP between interdigit web spaces  Neurovascularly intact     Limited ultrasound: right foot:  Normal appearing 3rd MTP joint and PIP joint  Normal appearing flexor tendon on plantar surface with static and dynamic testing  Normal appearing 4th and 2nd MTP joints  No increased vascularity over the 3rd PIP and MTP joint   Summary: normal exam.   Ultrasound and interpretation by Clearance Coots, MD     ASSESSMENT & PLAN:   Right foot pain Pain has improved at the MTP joint  and is more present at the 3rd PIP joint. Has been wearing post op shoe for 2 weeks with limited improvement.  - pennsaid  - buddy tape  - try to transition out of the post op shoe.  - if no improvement consider injection of PIP joint

## 2018-04-05 ENCOUNTER — Ambulatory Visit (INDEPENDENT_AMBULATORY_CARE_PROVIDER_SITE_OTHER): Payer: 59

## 2018-04-05 ENCOUNTER — Ambulatory Visit: Payer: 59 | Admitting: Family Medicine

## 2018-04-05 VITALS — BP 132/74 | HR 68 | Ht 66.0 in | Wt 205.0 lb

## 2018-04-05 DIAGNOSIS — M79671 Pain in right foot: Secondary | ICD-10-CM

## 2018-04-05 MED ORDER — DICLOFENAC SODIUM 2 % TD SOLN: 1.0000 "application " | Freq: Two times a day (BID) | TRANSDERMAL | 3 refills | Status: DC

## 2018-04-05 NOTE — Patient Instructions (Signed)
Good to see you  Please try the post op shoe intermittently at the end of the day if it is hurting  Please try the pennsaid  Please try the coban and buddy tape the the 2nd and 3rd toe together.  Please follow up with me in 2-4 weeks depending on how bad the pain is.

## 2018-04-05 NOTE — Assessment & Plan Note (Signed)
Pain has improved at the MTP joint and is more present at the 3rd PIP joint. Has been wearing post op shoe for 2 weeks with limited improvement.  - pennsaid  - buddy tape  - try to transition out of the post op shoe.  - if no improvement consider injection of PIP joint

## 2018-04-27 ENCOUNTER — Ambulatory Visit: Payer: 59 | Admitting: Podiatry

## 2018-05-01 ENCOUNTER — Ambulatory Visit: Payer: 59 | Admitting: Podiatry

## 2018-05-01 ENCOUNTER — Other Ambulatory Visit: Payer: Self-pay | Admitting: Podiatry

## 2018-05-01 ENCOUNTER — Ambulatory Visit (INDEPENDENT_AMBULATORY_CARE_PROVIDER_SITE_OTHER): Payer: 59

## 2018-05-01 ENCOUNTER — Encounter: Payer: Self-pay | Admitting: Podiatry

## 2018-05-01 DIAGNOSIS — D3610 Benign neoplasm of peripheral nerves and autonomic nervous system, unspecified: Secondary | ICD-10-CM

## 2018-05-01 DIAGNOSIS — G5701 Lesion of sciatic nerve, right lower limb: Secondary | ICD-10-CM | POA: Diagnosis not present

## 2018-05-01 DIAGNOSIS — R52 Pain, unspecified: Secondary | ICD-10-CM | POA: Diagnosis not present

## 2018-05-02 ENCOUNTER — Encounter: Payer: Self-pay | Admitting: Podiatry

## 2018-05-02 NOTE — Progress Notes (Signed)
This patient presents to the office with chief complaint of pain in her right forefoot.  She says that her pain has been present for over 2 months.  She says her pain comes and goes. She points to the area between the third and fourth toes of the right foot as the site of pain.  She says she is not having any pain for the last 3 days but decided to keep this appointment.  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 Patient states that she has been seen by her PCP who has twice performed ultrasounds and not found any pathology.  He did order her some topical pain medicine to help to control her pain.  Marland Kitchen He also prescribed her to walk with a surgical shoe. . She admits that I am her third consultation for the same problem.  She desires an evaluation and treatment of her right foot pain.  General Appearance  Alert, conversant and in no acute stress.  Vascular  Dorsalis pedis and posterior tibial  pulses are palpable  bilaterally.  Capillary return is within normal limits  bilaterally. Temperature is within normal limits  bilaterally.  Neurologic  Senn-Weinstein monofilament wire test within normal limits  bilaterally. Muscle power within normal limits bilaterally.  Nails Thick disfigured discolored nails with subungual debris  from hallux to fifth toes bilaterally. No evidence of bacterial infection or drainage bilaterally.  Orthopedic  No limitations of motion of motion feet .  No crepitus or effusions noted.  No bony pathology or digital deformities noted. Marland Kitchen No palpable pain noted in the third interspace of the right foot.  No redness, swelling noted in third interspace right foot.  No palpable pain noted plantarly.  Patient has a minimal Mulder's sign, right foot.  Skin  normotropic skin with no porokeratosis noted bilaterally.  No signs of infections or ulcers noted.    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  IE  . X-rays were taken and revealed no  evidence of bony pathology. Discussed this condition with this patient.  Told her in the absence of any pain, it is difficult to make a diagnosis.  Based on her description and previous treatment by Dr. Cannon Kettle. She is probably experiencing neuroma-like symptoms.  I told her to consider an insole to be worn to help to control hypermobility.  . She is to return to the office if the problems persist.   Gardiner Barefoot DPM

## 2018-05-25 DIAGNOSIS — D225 Melanocytic nevi of trunk: Secondary | ICD-10-CM | POA: Diagnosis not present

## 2018-05-25 DIAGNOSIS — Z1283 Encounter for screening for malignant neoplasm of skin: Secondary | ICD-10-CM | POA: Diagnosis not present

## 2018-05-25 DIAGNOSIS — L821 Other seborrheic keratosis: Secondary | ICD-10-CM | POA: Diagnosis not present

## 2018-05-25 DIAGNOSIS — L820 Inflamed seborrheic keratosis: Secondary | ICD-10-CM | POA: Diagnosis not present

## 2018-11-14 DIAGNOSIS — Z01419 Encounter for gynecological examination (general) (routine) without abnormal findings: Secondary | ICD-10-CM | POA: Diagnosis not present

## 2018-12-20 ENCOUNTER — Other Ambulatory Visit: Payer: Self-pay | Admitting: Family Medicine

## 2018-12-20 DIAGNOSIS — Z1231 Encounter for screening mammogram for malignant neoplasm of breast: Secondary | ICD-10-CM

## 2019-01-17 ENCOUNTER — Ambulatory Visit: Payer: 59

## 2019-03-07 ENCOUNTER — Ambulatory Visit
Admission: RE | Admit: 2019-03-07 | Discharge: 2019-03-07 | Disposition: A | Discharge status: Home / Self Care | Payer: 59 | Source: Ambulatory Visit | Attending: Family Medicine | Admitting: Family Medicine

## 2019-03-07 ENCOUNTER — Other Ambulatory Visit: Payer: Self-pay

## 2019-03-07 DIAGNOSIS — Z1231 Encounter for screening mammogram for malignant neoplasm of breast: Secondary | ICD-10-CM

## 2019-04-17 ENCOUNTER — Telehealth: Payer: Self-pay

## 2019-04-17 NOTE — Telephone Encounter (Signed)
Questions for Screening COVID-19  Symptom onset:n/a  Travel or Contacts: no  During this illness, did/does the patient experience any of the following symptoms? Fever >100.5F []   Yes [x]   No []   Unknown Subjective fever (felt feverish) []   Yes [x]   No []   Unknown Chills []   Yes [x]   No []   Unknown Muscle aches (myalgia) []   Yes [x]   No []   Unknown Runny nose (rhinorrhea) []   Yes [x]   No []   Unknown Sore throat []   Yes [x]   No []   Unknown Cough (new onset or worsening of chronic cough) []   Yes [x]   No []   Unknown Shortness of breath (dyspnea) []   Yes [x]   No []   Unknown Nausea or vomiting []   Yes [x]   No []   Unknown Headache []   Yes [x]   No []   Unknown Abdominal pain  []   Yes [x]   No []   Unknown Diarrhea (?3 loose/looser than normal stools/24hr period) []   Yes [x]   No []   Unknown Other, specify:  Patient risk factors: Smoker? []   Current []   Former []   Never If female, currently pregnant? []   Yes []   No  Patient Active Problem List   Diagnosis Date Noted  . Right foot pain 03/23/2018  . Acute pharyngitis 01/22/2015  . Obesity 01/22/2015  . Infected sebaceous cyst of skin 01/22/2015  . Multiple nevi 11/22/2013    Plan:  []   High risk for COVID-19 with red flags go to ED (with CP, SOB, weak/lightheaded, or fever > 101.5). Call ahead.  []   High risk for COVID-19 but stable. Inform provider and coordinate time for [facility] visit.   []   No red flags but URI signs or symptoms okay for [facility] visit.

## 2019-04-18 ENCOUNTER — Ambulatory Visit (INDEPENDENT_AMBULATORY_CARE_PROVIDER_SITE_OTHER): Payer: 59 | Admitting: Family Medicine

## 2019-04-18 ENCOUNTER — Encounter: Payer: Self-pay | Admitting: Family Medicine

## 2019-04-18 VITALS — BP 100/70 | HR 88 | Temp 98.5°F | Ht 66.0 in | Wt 216.4 lb

## 2019-04-18 DIAGNOSIS — Z23 Encounter for immunization: Secondary | ICD-10-CM

## 2019-04-18 DIAGNOSIS — E785 Hyperlipidemia, unspecified: Secondary | ICD-10-CM

## 2019-04-18 DIAGNOSIS — E559 Vitamin D deficiency, unspecified: Secondary | ICD-10-CM

## 2019-04-18 DIAGNOSIS — Z0000 Encounter for general adult medical examination without abnormal findings: Secondary | ICD-10-CM

## 2019-04-18 DIAGNOSIS — Z0001 Encounter for general adult medical examination with abnormal findings: Secondary | ICD-10-CM | POA: Insufficient documentation

## 2019-04-18 LAB — COMPREHENSIVE METABOLIC PANEL
ALT: 21 U/L (ref 0–35)
AST: 22 U/L (ref 0–37)
Albumin: 4.3 g/dL (ref 3.5–5.2)
Alkaline Phosphatase: 51 U/L (ref 39–117)
BUN: 12 mg/dL (ref 6–23)
CO2: 27 mEq/L (ref 19–32)
Calcium: 9.1 mg/dL (ref 8.4–10.5)
Chloride: 103 mEq/L (ref 96–112)
Creatinine, Ser: 0.78 mg/dL (ref 0.40–1.20)
GFR: 79.57 mL/min (ref >60.00)
Glucose, Bld: 98 mg/dL (ref 70–99)
Potassium: 4.4 mEq/L (ref 3.5–5.1)
Sodium: 137 mEq/L (ref 135–145)
Total Bilirubin: 0.9 mg/dL (ref 0.2–1.2)
Total Protein: 7.2 g/dL (ref 6.0–8.3)

## 2019-04-18 LAB — CBC
HCT: 43.4 % (ref 36.0–46.0)
Hemoglobin: 14.7 g/dL (ref 12.0–15.0)
MCHC: 33.8 g/dL (ref 30.0–36.0)
MCV: 88.2 fl (ref 78.0–100.0)
Platelets: 277 10*3/uL (ref 150.0–400.0)
RBC: 4.92 Mil/uL (ref 3.87–5.11)
RDW: 12.7 % (ref 11.5–15.5)
WBC: 8.2 10*3/uL (ref 4.0–10.5)

## 2019-04-18 LAB — LIPID PANEL
Cholesterol: 220 mg/dL — ABNORMAL HIGH (ref 0–200)
HDL: 53.7 mg/dL (ref >39.00)
LDL Cholesterol: 139 mg/dL — ABNORMAL HIGH (ref 0–99)
NonHDL: 165.83
Total CHOL/HDL Ratio: 4
Triglycerides: 136 mg/dL (ref 0.0–149.0)
VLDL: 27.2 mg/dL (ref 0.0–40.0)

## 2019-04-18 LAB — VITAMIN D 25 HYDROXY (VIT D DEFICIENCY, FRACTURES): VITD: 27.49 ng/mL — ABNORMAL LOW (ref 30.00–100.00)

## 2019-04-18 LAB — TSH: TSH: 1.98 u[IU]/mL (ref 0.35–4.50)

## 2019-04-18 NOTE — Patient Instructions (Addendum)
Great to see you. I will call you with your lab results from today and you can view them online.   

## 2019-04-18 NOTE — Assessment & Plan Note (Signed)
Has been diet controlled but not checked in several years. Due for labs today. Orders Placed This Encounter  Procedures  . Tdap vaccine greater than or equal to 46yo IM  . Lipid panel  . CBC  . TSH  . Comprehensive metabolic panel  . Vitamin D (25 hydroxy)

## 2019-04-18 NOTE — Progress Notes (Signed)
Subjective:   Patient ID: Sharon Mckenzie, female    DOB: 1971/01/20, 48 y.o.   MRN: [id number]  Sharon Mckenzie is a pleasant 48 y.o. year old female who presents to clinic today with Annual Exam (CPE-- fasting/ wants TDAP)  on 04/18/2019  HPI:  Here for CPX. Doing well.  Working remotely and misses interacting with her coworkers but otherwise has no complaints.  Health Maintenance  Topic Date Due  . HIV Screening  06/20/1988  . TETANUS/TDAP  06/19/2018  . INFLUENZA VACCINE  05/12/2019  . PAP SMEAR-Modifier  10/13/2020  Has GYN- Last pap smear normal 10/2017([facility]).  No h/o abnormal pap smears.  She is sexually active with one partner only. No family history of breast, uterine, cervical or ovarian cancer. Denies any dysuria or vaginal discharge. Has IUD. Mammogram was done on 03/07/19.  Lab Results  Component Value Date   CHOL 205 (H) 01/22/2015   HDL 44.00 01/22/2015   LDLCALC 139 (H) 01/22/2015   LDLDIRECT 164.6 09/13/2012   TRIG 111.0 01/22/2015   CHOLHDL 5 01/22/2015   Sees dermatologist- sees Sharon derm- appt noxt month.  Current Outpatient Medications on File Prior to Visit  Medication Sig Dispense Refill  . levonorgestrel (MIRENA) 20 MCG/24HR IUD 1 each by Intrauterine route once.    . Multiple Vitamin (MULTIVITAMIN) tablet Take 1 tablet by mouth daily.    . cholecalciferol (VITAMIN D) 1000 units tablet Take 1,000 Units by mouth daily.    . Diclofenac Sodium (PENNSAID) 2 % SOLN Place 1 application onto the skin 2 (two) times daily. (Patient not taking: Reported on 04/18/2019) 1 Bottle 3   No current facility-administered medications on file prior to visit.     No Known Allergies  Past Medical History:  Diagnosis Date  . Asthma   . Seasonal allergies     Past Surgical History:  Procedure Laterality Date  . CHOLECYSTECTOMY      Family History  Problem Relation Age of Onset  . Hyperlipidemia Mother   . Hypertension Mother     Social History    Socioeconomic History  . Marital status: Married    Spouse name: Not on file  . Number of children: Not on file  . Years of education: Not on file  . Highest education level: Not on file  Occupational History  . Not on file  Social Needs  . Financial resource strain: Not on file  . Food insecurity    Worry: Not on file    Inability: Not on file  . Transportation needs    Medical: Not on file    Non-medical: Not on file  Tobacco Use  . Smoking status: Never Smoker  . Smokeless tobacco: Never Used  Substance and Sexual Activity  . Alcohol use: Yes    Alcohol/week: 2.0 - 3.0 standard drinks    Types: 2 - 3 Standard drinks or equivalent per week  . Drug use: No  . Sexual activity: Not on file  Lifestyle  . Physical activity    Days per week: Not on file    Minutes per session: Not on file  . Stress: Not on file  Relationships  . Social Herbalist on phone: Not on file    Gets together: Not on file    Attends religious service: Not on file    Active member of club or organization: Not on file    Attends meetings of clubs or organizations: Not on file  Relationship status: Not on file  . Intimate partner violence    Fear of current or ex partner: Not on file    Emotionally abused: Not on file    Physically abused: Not on file    Forced sexual activity: Not on file  Other Topics Concern  . Not on file  Social History Narrative   Married, no children.   Works for [employer].   The PMH, [place], Social History, Family History, Medications, and allergies have been reviewed in [facility], and have been updated if relevant.  Review of Systems  Constitutional: Negative.   HENT: Negative.   Eyes: Negative.   Respiratory: Negative.   Cardiovascular: Negative.   Gastrointestinal: Negative.   Endocrine: Negative.   Genitourinary: Negative.   Musculoskeletal: Negative.   Skin: Negative.   Allergic/Immunologic: Negative.   Neurological: Negative.    Hematological: Negative.   Psychiatric/Behavioral: Negative.   All other systems reviewed and are negative.      Objective:    BP 100/70   Pulse 88   Temp 98.5 F (36.9 C) (Oral)   Ht 5\' 6"  (1.676 m)   Wt 216 lb 6.4 oz (98.2 kg)   SpO2 100%   BMI 34.93 kg/m  Wt Readings from Last 3 Encounters:  04/18/19 216 lb 6.4 oz (98.2 kg)  04/05/18 205 lb (93 kg)  03/23/18 203 lb (92.1 kg)     Physical Exam   General:  Well-developed,well-nourished,in no acute distress; alert,appropriate and cooperative throughout examination Head:  normocephalic and atraumatic.   Eyes:  vision grossly intact, PERRL Ears:  R ear normal and L ear normal externally, TMs clear bilaterally Nose:  no external deformity.   Mouth:  good dentition.   Neck:  No deformities, masses, or tenderness noted.  Lungs:  Normal respiratory effort, chest expands symmetrically. Lungs are clear to auscultation, no crackles or wheezes. Heart:  Normal rate and regular rhythm. S1 and S2 normal without gallop, murmur, click, rub or other extra sounds. Abdomen:  Bowel sounds positive,abdomen soft and non-tender without masses, organomegaly or hernias noted. Msk:  No deformity or scoliosis noted of thoracic or lumbar spine.   Extremities:  No clubbing, cyanosis, edema, or deformity noted with normal full range of motion of all joints.   Neurologic:  alert & oriented X3 and gait normal.   Skin:  Intact without suspicious lesions or rashes Cervical Nodes:  No lymphadenopathy noted Axillary Nodes:  No palpable lymphadenopathy Psych:  Cognition and judgment appear intact. Alert and cooperative with normal attention span and concentration. No apparent delusions, illusions, hallucinations       Assessment & Plan:   Well woman exam without gynecological exam -   Need for tetanus, diphtheria, and acellular pertussis (Tdap) vaccine - Plan: Tdap vaccine greater than or equal to 7yo IM,  Hyperlipidemia, unspecified  hyperlipidemia type - Plan: Lipid panel, CBC, TSH, Comprehensive metabolic panel,   Vitamin D deficiency - Plan: Vitamin D (25 hydroxy), No follow-ups on file.

## 2019-04-18 NOTE — Assessment & Plan Note (Signed)
Reviewed preventive care protocols, scheduled due services, and updated immunizations Discussed nutrition, exercise, diet, and healthy lifestyle.  

## 2019-04-18 NOTE — Assessment & Plan Note (Signed)
H/o Vit D deficiency- will recheck today.

## 2019-04-19 ENCOUNTER — Other Ambulatory Visit: Payer: Self-pay

## 2019-04-19 MED ORDER — VITAMIN D3 1.25 MG (50000 UT) PO CAPS: 50000.0000 [IU] | ORAL_CAPSULE | ORAL | 0 refills | Status: DC

## 2019-04-19 NOTE — Progress Notes (Signed)
vi 

## 2019-07-03 ENCOUNTER — Other Ambulatory Visit: Payer: Self-pay | Admitting: Family Medicine

## 2019-07-16 ENCOUNTER — Encounter: Payer: Self-pay | Admitting: Family Medicine

## 2019-07-18 ENCOUNTER — Telehealth: Payer: Self-pay

## 2019-07-18 NOTE — Telephone Encounter (Signed)

## 2019-07-19 ENCOUNTER — Other Ambulatory Visit (INDEPENDENT_AMBULATORY_CARE_PROVIDER_SITE_OTHER): Payer: 59

## 2019-07-19 ENCOUNTER — Encounter: Payer: Self-pay | Admitting: Family Medicine

## 2019-07-19 ENCOUNTER — Other Ambulatory Visit: Payer: Self-pay

## 2019-07-19 DIAGNOSIS — E559 Vitamin D deficiency, unspecified: Secondary | ICD-10-CM | POA: Diagnosis not present

## 2019-07-19 LAB — VITAMIN D 25 HYDROXY (VIT D DEFICIENCY, FRACTURES): VITD: 60.17 ng/mL (ref 30.00–100.00)

## 2019-10-24 LAB — HM PAP SMEAR

## 2020-03-05 ENCOUNTER — Other Ambulatory Visit: Payer: Self-pay | Admitting: Family Medicine

## 2020-03-05 DIAGNOSIS — Z1231 Encounter for screening mammogram for malignant neoplasm of breast: Secondary | ICD-10-CM

## 2020-03-06 ENCOUNTER — Other Ambulatory Visit: Payer: Self-pay | Admitting: Family Medicine

## 2020-03-06 DIAGNOSIS — Z1231 Encounter for screening mammogram for malignant neoplasm of breast: Secondary | ICD-10-CM

## 2020-03-07 ENCOUNTER — Other Ambulatory Visit: Payer: Self-pay

## 2020-03-07 ENCOUNTER — Ambulatory Visit
Admission: RE | Admit: 2020-03-07 | Discharge: 2020-03-07 | Disposition: A | Discharge status: Home / Self Care | Payer: 59 | Source: Ambulatory Visit

## 2020-03-07 DIAGNOSIS — Z1231 Encounter for screening mammogram for malignant neoplasm of breast: Secondary | ICD-10-CM

## 2020-05-21 IMAGING — MG DIGITAL SCREENING BILATERAL MAMMOGRAM WITH TOMO AND CAD
6 of 10 series · 6 of 30 positions shown · non-contrast
Comparison: Previous exam(s).

CLINICAL DATA: Screening.

EXAM:
DIGITAL SCREENING BILATERAL MAMMOGRAM WITH TOMO AND CAD

[R CC synth-2D]
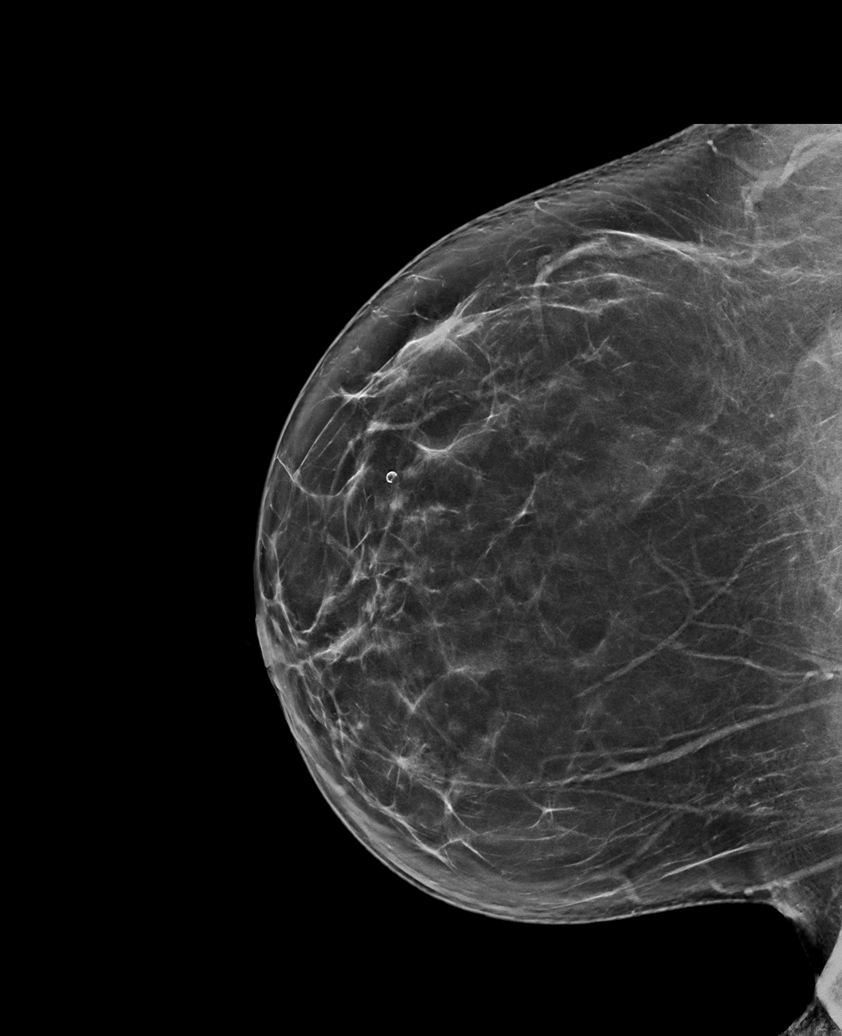

[L CC synth-2D (1 of 2)]
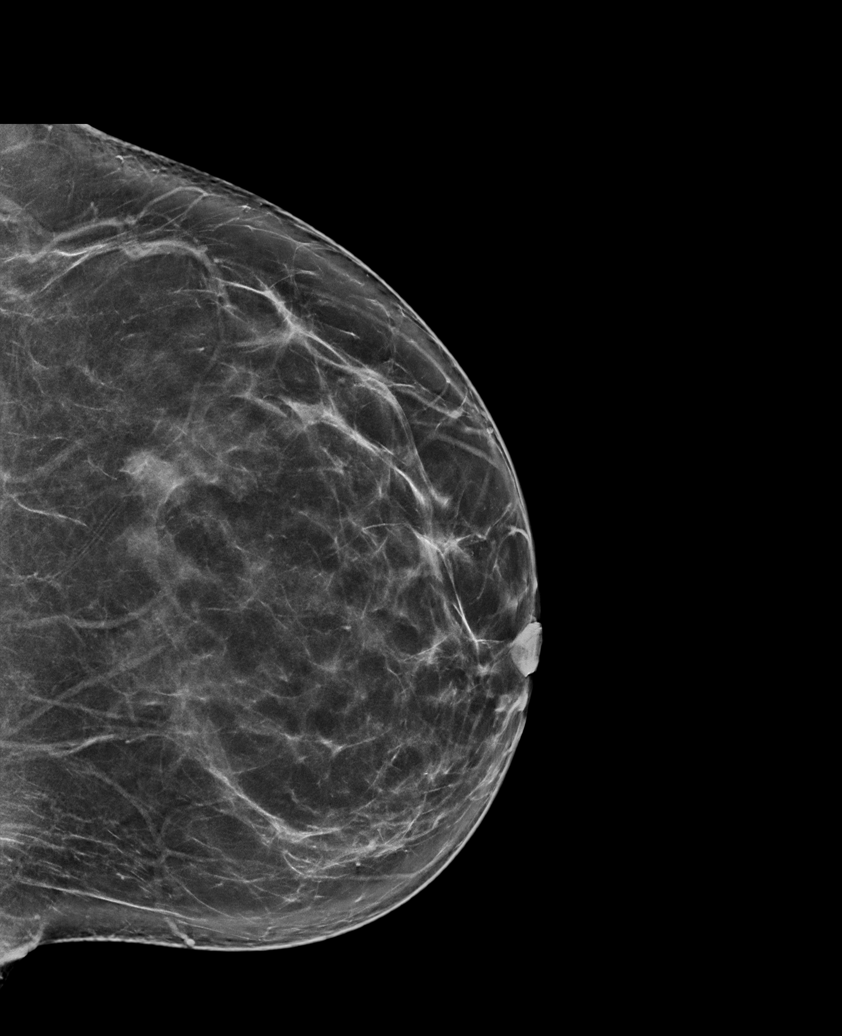

[R MLO synth-2D]
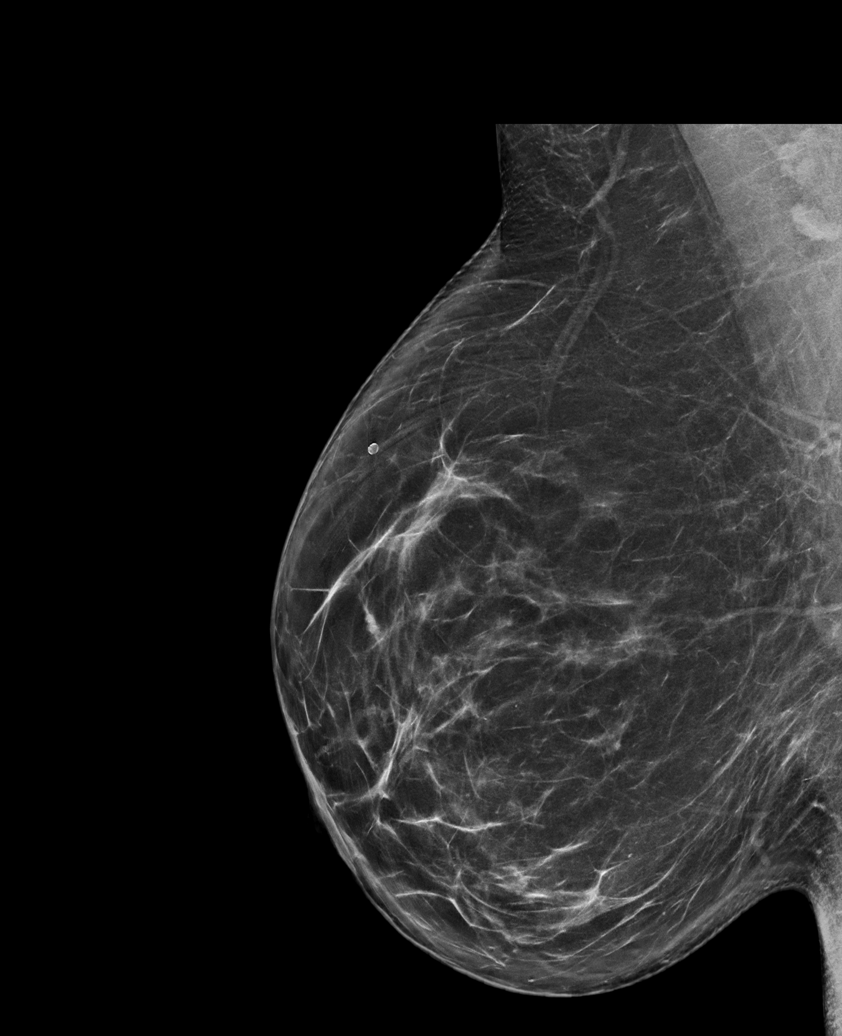

[L MLO synth-2D]
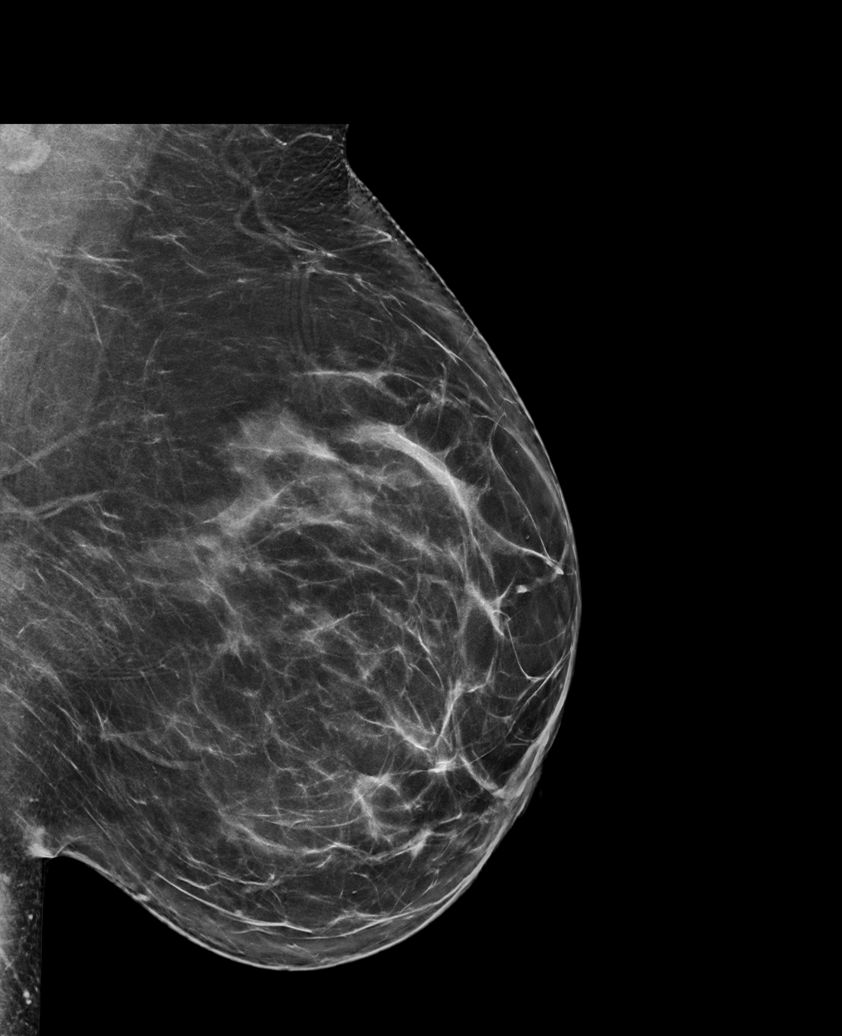

[L CC synth-2D (2 of 2)]
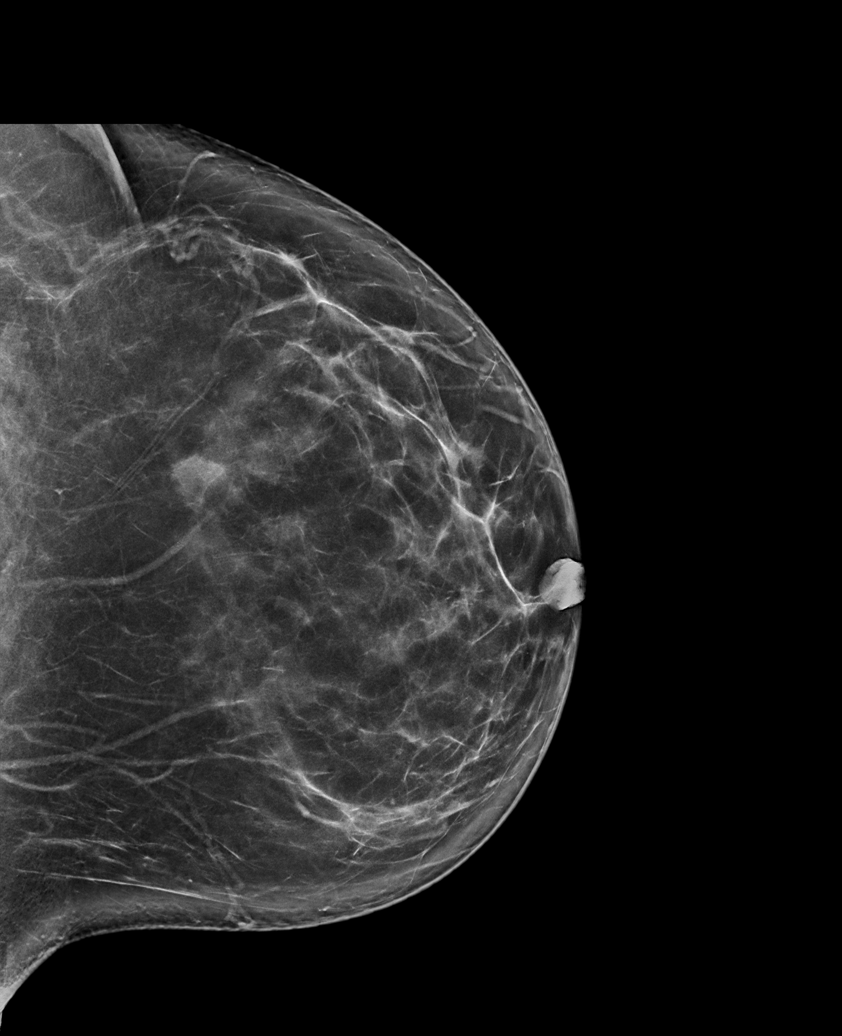

[L CC tomo · tomo slice 45/88.0]
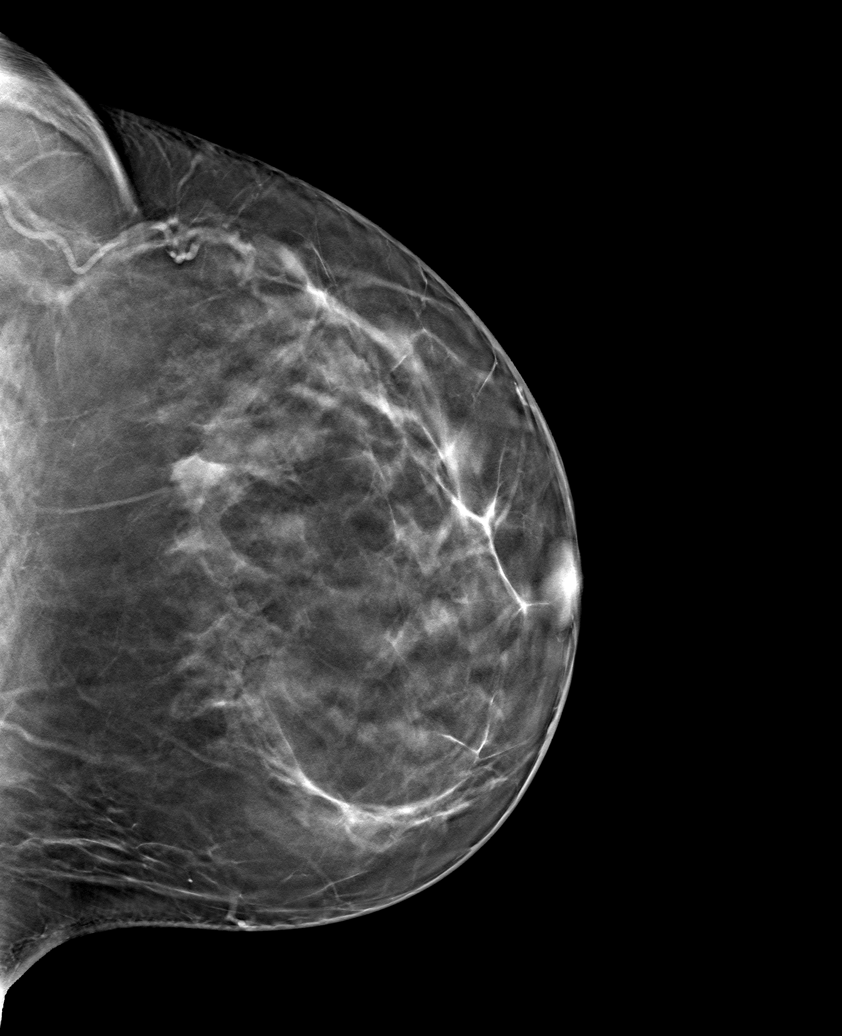

[6 of 30 positions shown; findings below may reference images not displayed]

ACR Breast Density Category b: There are scattered areas of
fibroglandular density.
FINDINGS: There are no findings suspicious for malignancy. Images were
processed with CAD.
IMPRESSION: No mammographic evidence of malignancy. A result letter of this
screening mammogram will be mailed directly to the patient.

RECOMMENDATION:
Screening mammogram in one year. (Code:[id number])

BI-RADS CATEGORY  1: Negative.

## 2020-07-16 ENCOUNTER — Other Ambulatory Visit: Payer: Self-pay

## 2020-07-16 ENCOUNTER — Ambulatory Visit (INDEPENDENT_AMBULATORY_CARE_PROVIDER_SITE_OTHER): Payer: 59 | Admitting: Dermatology

## 2020-07-16 ENCOUNTER — Encounter: Payer: Self-pay | Admitting: Dermatology

## 2020-07-16 DIAGNOSIS — L720 Epidermal cyst: Secondary | ICD-10-CM | POA: Diagnosis not present

## 2020-07-16 DIAGNOSIS — L820 Inflamed seborrheic keratosis: Secondary | ICD-10-CM | POA: Diagnosis not present

## 2020-07-16 DIAGNOSIS — D1800 Hemangioma unspecified site: Secondary | ICD-10-CM

## 2020-07-16 DIAGNOSIS — L821 Other seborrheic keratosis: Secondary | ICD-10-CM

## 2020-07-16 DIAGNOSIS — Z1283 Encounter for screening for malignant neoplasm of skin: Secondary | ICD-10-CM

## 2020-07-16 DIAGNOSIS — D229 Melanocytic nevi, unspecified: Secondary | ICD-10-CM

## 2020-07-16 DIAGNOSIS — D367 Benign neoplasm of other specified sites: Secondary | ICD-10-CM

## 2020-07-16 DIAGNOSIS — D235 Other benign neoplasm of skin of trunk: Secondary | ICD-10-CM

## 2020-07-16 DIAGNOSIS — L814 Other melanin hyperpigmentation: Secondary | ICD-10-CM

## 2020-07-16 DIAGNOSIS — D239 Other benign neoplasm of skin, unspecified: Secondary | ICD-10-CM

## 2020-07-16 DIAGNOSIS — L578 Other skin changes due to chronic exposure to nonionizing radiation: Secondary | ICD-10-CM

## 2020-07-16 DIAGNOSIS — D369 Benign neoplasm, unspecified site: Secondary | ICD-10-CM

## 2020-07-16 NOTE — Patient Instructions (Addendum)
Recommend daily broad spectrum sunscreen SPF 30+ to sun-exposed areas, reapply every 2 hours as needed. Call for new or changing lesions. Prior to procedure, discussed risks of blister formation, small wound, skin dyspigmentation, or rare scar following cryotherapy.  Liquid nitrogen was applied for 10-12 seconds to the skin lesion and the expected blistering or scabbing reaction explained. Do not pick at the area. Patient reminded to expect hypopigmented scars from the procedure. Return if lesion fails to fully resolve.  Cryotherapy Aftercare  . Wash gently with soap and water everyday.   . Apply Vaseline and Band-Aid daily until healed.   Pre-Operative Instructions  You are scheduled for a surgical procedure at [facility]. We recommend you read the following instructions. If you have any questions or concerns, please call the office at [phone].  1. Shower and wash the entire body with soap and water the day of your surgery paying special attention to cleansing at and around the planned surgery site.  2. Avoid aspirin or aspirin containing products at least fourteen (14) days prior to your surgical procedure and for at least one week (7 Days) after your surgical procedure. If you take aspirin on a regular basis for heart disease or history of stroke or for any other reason, we may recommend you continue taking aspirin but please notify us if you take this on a regular basis. Aspirin can cause more bleeding to occur during surgery as well as prolonged bleeding and bruising after surgery.   3. Avoid other nonsteroidal pain medications at least one week prior to surgery and at least one week prior to your surgery. These include medications such as Ibuprofen (Motrin, Advil and Nuprin), Naprosyn, Voltaren, Relafen, etc. If medications are used for therapeutic reasons, please inform us as they can cause increased bleeding or prolonged bleeding during and bruising after surgical procedures.    4. Please advice us if you are taking any "blood thinner" medications such as Coumadin or Dipyridamole or Plavix or similar medications. These cause increased bleeding and prolonged bleeding during and bruising after surgical procedures. We may have to consider discontinuing these medications briefly prior to and shortly after your surgery, if safe to do so.   5. Please inform us of all medications you are currently taking. All medications that are taken regularly should be taken the day of surgery as you always do. Nevertheless, we need to be informed of what medications you are taking prior to surgery to whether they will affect the procedure or cause any complications.   6. Please inform us of any medication allergies. Also inform us of whether you have allergies to Latex or rubber products or whether you have had any adverse reaction to Lidocaine or Epinephrine.  7. Please inform us of any prosthetic or artificial body parts such as artificial heart valve, joint replacements, etc., or similar condition that might require preoperative antibiotics.   8. We recommend avoidance of alcohol at least two weeks prior to surgery and continued avoidence for at least two weeks after surgery.   9. We recommend discontinuation of tobacco smoking at least two weeks prior to surgery and continued abstinence for at least two weeks after surgery.  10. Do not plan strenuous exercise, strenuous work or strenuous lifting for approximately four weeks after your surgery.   11. We request if you are unable to make your scheduled surgical appointment, please call us at least a week in advance or as soon as you are aware of a problem   sot aht we can cancel or reschedule you.   12. You MAKE TAKE TYLENOL (acetaminophen) for pain as it is not a blood thinner.   13. PLEASE PLAN TO BE IN TOWN FOR TWO WEEKS FOLLOWING SURGERY, THIS IS IMPORTANT SO YOU CAN BE CHECKED FOR DRESSING CHANGES, SUTURE REMOVAL AND TO MONITOR FOR  POSSIBLE COMPLICATIONS. 

## 2020-07-16 NOTE — Progress Notes (Signed)
Follow-Up Visit   Subjective  Sharon Mckenzie is a 49 y.o. female who presents for the following: FBSE.  Patient here for full body skin exam and skin cancer screening. Patient has a few areas of concern today on her Right shoulder, right arm and mid back that she would like to have evaluated today. Patient has no h/o skin cancer  The following portions of the chart were reviewed this encounter and updated as appropriate:  Tobacco  Allergies  Meds  Problems  Med Hx  Surg Hx  Fam Hx      Review of Systems:  No other skin or systemic complaints except as noted in HPI or Assessment and Plan.  Objective  Well appearing patient in no apparent distress; mood and affect are within normal limits.  A full examination was performed including scalp, head, eyes, ears, nose, lips, neck, chest, axillae, abdomen, back, buttocks, bilateral upper extremities, bilateral lower extremities, hands, feet, fingers, toes, fingernails, and toenails. All findings within normal limits unless otherwise noted below.  Objective  Left base of Neck, Right Superior Shoulder x 2 (2): Erythematous keratotic or waxy stuck-on papule or plaque.   Objective  Low chest left of midline: Subcutaneous nodule.    Upper back at Midline: 1.0 cm Subcutaneous nodule.   Objective  Right Columella of Nose: Small pink papule  Objective  Right Buttock: Firm pink/brown papulenodule with dimple sign.    Assessment & Plan  Inflamed seborrheic keratosis (3) Right Superior Shoulder x 2 (2); Left base of Neck  Cryotherapy today Prior to procedure, discussed risks of blister formation, small wound, skin dyspigmentation, or rare scar following cryotherapy.    Destruction of lesion - Right Superior Shoulder x 2  Destruction method: cryotherapy   Informed consent: discussed and consent obtained   Lesion destroyed using liquid nitrogen: Yes   Outcome: patient tolerated procedure well with no complications     Post-procedure details: wound care instructions given    Epidermal cyst (2) Low chest left of midline; Upper back at Midline  Benign-appearing. Exam most consistent with an epidermal inclusion cyst. Discussed that a cyst is a benign growth that can grow over time and sometimes get irritated or inflamed. Recommend observation if it is not bothersome. Discussed option of excision to remove it if it is growing or bothersome.   Angiofibroma Right Columella of Nose  Benign-appearing.  Observation.  Call clinic for new or changing lesions.  Recommend daily use of broad spectrum spf 30+ sunscreen to sun-exposed areas.    Dermatofibroma Right Buttock  Benign-appearing.  Observation.  Call clinic for new or changing lesions.  Recommend daily use of broad spectrum spf 30+ sunscreen to sun-exposed areas.     Lentigines - Scattered tan macules - Discussed due to sun exposure - Benign, observe - Call for any changes  Seborrheic Keratoses - Stuck-on, waxy, tan-brown papules and plaques  - Discussed benign etiology and prognosis. - Observe - Call for any changes  Melanocytic Nevi - Tan-brown and/or pink-flesh-colored symmetric macules and papules - Benign appearing on exam today - Observation - Call clinic for new or changing moles - Recommend daily use of broad spectrum spf 30+ sunscreen to sun-exposed areas.   Hemangiomas - Red papules - Discussed benign nature - Observe - Call for any changes  Actinic Damage - diffuse scaly erythematous macules with underlying dyspigmentation - Recommend daily broad spectrum sunscreen SPF 30+ to sun-exposed areas, reapply every 2 hours as needed.  - Call for new  or changing lesions.  Skin cancer screening performed today.   Return in about 1 year (around 07/16/2021) for TBSE, first available surgery for cyst removal.  I, Donzetta Kohut, CMA, am acting as scribe for Forest Gleason, MD .  Documentation: I have reviewed the above documentation  for accuracy and completeness, and I agree with the above.  Forest Gleason, MD

## 2020-09-17 ENCOUNTER — Other Ambulatory Visit: Payer: Self-pay

## 2020-09-17 ENCOUNTER — Ambulatory Visit (INDEPENDENT_AMBULATORY_CARE_PROVIDER_SITE_OTHER): Payer: 59 | Admitting: Dermatology

## 2020-09-17 DIAGNOSIS — D485 Neoplasm of uncertain behavior of skin: Secondary | ICD-10-CM | POA: Diagnosis not present

## 2020-09-17 MED ORDER — MUPIROCIN 2 % EX OINT: 1.0000 "application " | TOPICAL_OINTMENT | Freq: Two times a day (BID) | CUTANEOUS | 0 refills | Status: DC

## 2020-09-17 NOTE — Patient Instructions (Signed)

## 2020-09-17 NOTE — Progress Notes (Signed)
   Follow-Up Visit   Subjective  Sharon Mckenzie is a 49 y.o. female who presents for the following: Procedure (Patient here today for excision of cyst at upper back midline. ).  The following portions of the chart were reviewed this encounter and updated as appropriate:   Tobacco  Allergies  Meds  Problems  Med Hx  Surg Hx  Fam Hx      Review of Systems:  No other skin or systemic complaints except as noted in HPI or Assessment and Plan.  Objective  Well appearing patient in no apparent distress; mood and affect are within normal limits.  A focused examination was performed including back. Relevant physical exam findings are noted in the Assessment and Plan.  Objective  Upper Mid Back: 0.6cm firm subcutaneous nodule   Assessment & Plan  Neoplasm of uncertain behavior of skin Upper Mid Back  Skin excision  Lesion length (cm):  0.8 Total excision diameter (cm):  0.6 Informed consent: discussed and consent obtained   Timeout: patient name, date of birth, surgical site, and procedure verified   Procedure prep:  Patient was prepped and draped in usual sterile fashion Prep type:  Chlorhexidine Anesthesia: the lesion was anesthetized in a standard fashion   Anesthetic:  1% lidocaine w/ epinephrine 1-100,000 buffered w/ 8.4% NaHCO3 (10cc) Instrument used comment:  15c Hemostasis achieved with: suture, pressure and electrodesiccation   Outcome: patient tolerated procedure well with no complications   Post-procedure details: wound care instructions given    Skin repair Complexity:  Intermediate Final length (cm):  1.1 Informed consent: discussed and consent obtained   Timeout: patient name, date of birth, surgical site, and procedure verified   Procedure prep:  Patient was prepped and draped in usual sterile fashion Prep type:  Chlorhexidine Anesthesia: the lesion was anesthetized in a standard fashion   Anesthetic:  1% lidocaine w/ epinephrine 1-100,000 local  infiltration Reason for type of repair: reduce tension to allow closure, reduce the risk of dehiscence, infection, and necrosis and enhance both functionality and cosmetic results   Undermining: edges undermined   Subcutaneous layers (deep stitches):  Suture size:  3-0 Suture type: Vicryl (polyglactin 910)   Stitches:  Buried vertical mattress Fine/surface layer approximation (top stitches):  Suture size:  4-0 Suture type: Prolene (polypropylene)   Suture removal (days):  7 Hemostasis achieved with: suture, pressure and electrodesiccation Outcome: patient tolerated procedure well with no complications   Post-procedure details: wound care instructions given   Additional details:  Mupirocin and a pressure dressing applied  Return in about 1 week (around 09/24/2020) for Suture Removal.  Graciella Belton, RMA, am acting as scribe for Forest Gleason, MD .  Documentation: I have reviewed the above documentation for accuracy and completeness, and I agree with the above.  Forest Gleason, MD

## 2020-09-18 ENCOUNTER — Encounter: Payer: Self-pay | Admitting: Dermatology

## 2020-09-24 ENCOUNTER — Encounter: Payer: Self-pay | Admitting: Dermatology

## 2020-09-24 ENCOUNTER — Ambulatory Visit: Payer: 59 | Admitting: Dermatology

## 2020-09-24 ENCOUNTER — Other Ambulatory Visit: Payer: Self-pay

## 2020-09-24 DIAGNOSIS — L720 Epidermal cyst: Secondary | ICD-10-CM

## 2020-09-24 DIAGNOSIS — Z4802 Encounter for removal of sutures: Secondary | ICD-10-CM

## 2020-09-24 NOTE — Progress Notes (Signed)
   Follow-Up Visit   Subjective  Sharon Mckenzie is a 49 y.o. female who presents for the following: Suture / Staple Removal (I wk suture removal for excision of biopsy proven epidermal inclusion cyst).  The following portions of the chart were reviewed this encounter and updated as appropriate:  Tobacco  Allergies  Meds  Problems  Med Hx  Surg Hx  Fam Hx      Review of Systems: No other skin or systemic complaints except as noted in HPI or Assessment and Plan.   Objective  Well appearing patient in no apparent distress; mood and affect are within normal limits.  A focused examination was performed including back. Relevant physical exam findings are noted in the Assessment and Plan.  Objective  Upper Mid Back: Post excision, well healing scar.     Assessment & Plan  Epidermal inclusion cyst Upper Mid Back  Encounter for Removal of Sutures - Incision site at the upper mid back is clean, dry and intact - Wound cleansed, sutures removed, wound cleansed and steri strips applied.  - Discussed pathology results showing epidermal inclusion cyst  - Patient advised to keep steri-strips dry until they fall off. - Scars remodel for a full year. - Once steri-strips fall off, patient can apply over-the-counter silicone scar cream each night to help with scar remodeling if desired. - Patient advised to call with any concerns or if they notice any new or changing lesions.   Return in 6 weeks (on 11/05/2020) for as scheduled.   I, Harriett Sine, CMA, am acting as scribe for Forest Gleason, MD.  Documentation: I have reviewed the above documentation for accuracy and completeness, and I agree with the above.  Forest Gleason, MD

## 2020-09-24 NOTE — Patient Instructions (Signed)

## 2020-11-05 ENCOUNTER — Encounter: Payer: 59 | Admitting: Dermatology

## 2021-06-04 ENCOUNTER — Other Ambulatory Visit: Payer: Self-pay

## 2021-06-04 ENCOUNTER — Other Ambulatory Visit: Payer: Self-pay | Admitting: Family Medicine

## 2021-06-04 DIAGNOSIS — Z1231 Encounter for screening mammogram for malignant neoplasm of breast: Secondary | ICD-10-CM

## 2021-07-09 ENCOUNTER — Other Ambulatory Visit: Payer: Self-pay

## 2021-07-09 ENCOUNTER — Other Ambulatory Visit: Payer: Self-pay | Admitting: Family Medicine

## 2021-07-09 ENCOUNTER — Ambulatory Visit
Admission: RE | Admit: 2021-07-09 | Discharge: 2021-07-09 | Disposition: A | Discharge status: Home / Self Care | Payer: 59 | Source: Ambulatory Visit

## 2021-07-09 ENCOUNTER — Other Ambulatory Visit: Payer: Self-pay | Admitting: Nurse Practitioner

## 2021-07-09 DIAGNOSIS — Z1231 Encounter for screening mammogram for malignant neoplasm of breast: Secondary | ICD-10-CM

## 2021-07-22 ENCOUNTER — Ambulatory Visit: Payer: 59 | Admitting: Dermatology

## 2021-07-22 ENCOUNTER — Encounter: Payer: Self-pay | Admitting: Dermatology

## 2021-07-22 ENCOUNTER — Other Ambulatory Visit: Payer: Self-pay

## 2021-07-22 DIAGNOSIS — D229 Melanocytic nevi, unspecified: Secondary | ICD-10-CM

## 2021-07-22 DIAGNOSIS — L720 Epidermal cyst: Secondary | ICD-10-CM

## 2021-07-22 DIAGNOSIS — L814 Other melanin hyperpigmentation: Secondary | ICD-10-CM

## 2021-07-22 DIAGNOSIS — L821 Other seborrheic keratosis: Secondary | ICD-10-CM

## 2021-07-22 DIAGNOSIS — D235 Other benign neoplasm of skin of trunk: Secondary | ICD-10-CM | POA: Diagnosis not present

## 2021-07-22 DIAGNOSIS — L578 Other skin changes due to chronic exposure to nonionizing radiation: Secondary | ICD-10-CM | POA: Diagnosis not present

## 2021-07-22 DIAGNOSIS — Z1283 Encounter for screening for malignant neoplasm of skin: Secondary | ICD-10-CM

## 2021-07-22 DIAGNOSIS — D1800 Hemangioma unspecified site: Secondary | ICD-10-CM

## 2021-07-22 NOTE — Patient Instructions (Addendum)
Melanoma ABCDEs  Melanoma is the most dangerous type of skin cancer, and is the leading cause of death from skin disease.  You are more likely to develop melanoma if you: Have light-colored skin, light-colored eyes, or red or blond hair Spend a lot of time in the sun Tan regularly, either outdoors or in a tanning bed Have had blistering sunburns, especially during childhood Have a close family member who has had a melanoma Have atypical moles or large birthmarks  Early detection of melanoma is key since treatment is typically straightforward and cure rates are extremely high if we catch it early.   The first sign of melanoma is often a change in a mole or a new dark spot.  The ABCDE system is a way of remembering the signs of melanoma.  A for asymmetry:  The two halves do not match. B for border:  The edges of the growth are irregular. C for color:  A mixture of colors are present instead of an even brown color. D for diameter:  Melanomas are usually (but not always) greater than 32mm - the size of a pencil eraser. E for evolution:  The spot keeps changing in size, shape, and color.  Please check your skin once per month between visits. You can use a small mirror in front and a large mirror behind you to keep an eye on the back side or your body.   If you see any new or changing lesions before your next follow-up, please call to schedule a visit.  Please continue daily skin protection including broad spectrum sunscreen SPF 30+ to sun-exposed areas, reapplying every 2 hours as needed when you're outdoors.   Staying in the shade or wearing long sleeves, sun glasses (UVA+UVB protection) and wide brim hats (4-inch brim around the entire circumference of the hat) are also recommended for sun protection.   Recommend taking Heliocare sun protection supplement daily in sunny weather for additional sun protection. For maximum protection on the sunniest days, you can take up to 2 capsules of regular  Heliocare OR take 1 capsule of Heliocare Ultra. For prolonged exposure (such as a full day in the sun), you can repeat your dose of the supplement 4 hours after your first dose. Heliocare can be purchased at [facility] or at [email or web address].   If you have any questions or concerns for your doctor, please call our main line at [phone] and press option 4 to reach your doctor's medical assistant. If no one answers, please leave a voicemail as directed and we will return your call as soon as possible. Messages left after 4 pm will be answered the following business day.   You may also send [place] a message via [place]. We typically respond to MyChart messages within 1-2 business days.  For prescription refills, please ask your pharmacy to contact our office. Our fax number is [phone].  If you have an urgent issue when the clinic is closed that cannot wait until the next business day, you can page your doctor at the number below.    Please note that while we do our best to be available for urgent issues outside of office hours, we are not available 24/7.   If you have an urgent issue and are unable to reach [place], you may choose to seek medical care at your doctor's office, retail clinic, urgent care center, or emergency room.  If you have a medical emergency, please immediately call 911 or go to the emergency  department.  Pager Numbers  - Dr. Nehemiah Massed: [phone]  - Dr. Laurence Ferrari: [phone]  - Dr. Nicole Kindred: [phone]  In the event of inclement weather, please call our main line at [phone] for an update on the status of any delays or closures.  Dermatology Medication Tips: Please keep the boxes that topical medications come in in order to help keep track of the instructions about where and how to use these. Pharmacies typically print the medication instructions only on the boxes and not directly on the medication tubes.   If your medication is too expensive, please  contact our office at [phone] option 4 or send [place] a message through [place].   We are unable to tell what your co-pay for medications will be in advance as this is different depending on your insurance coverage. However, we may be able to find a substitute medication at lower cost or fill out paperwork to get insurance to cover a needed medication.   If a prior authorization is required to get your medication covered by your insurance company, please allow [place] 1-2 business days to complete this process.  Drug prices often vary depending on where the prescription is filled and some pharmacies may offer cheaper prices.  The website www.goodrx.com contains coupons for medications through different pharmacies. The prices here do not account for what the cost may be with help from insurance (it may be cheaper with your insurance), but the website can give you the price if you did not use any insurance.  - You can print the associated coupon and take it with your prescription to the pharmacy.  - You may also stop by our office during regular business hours and pick up a GoodRx coupon card.  - If you need your prescription sent electronically to a different pharmacy, notify our office through [facility] or by phone at [phone] option 4.     Pre-Operative Instructions  You are scheduled for a surgical procedure at [facility]. We recommend you read the following instructions. If you have any questions or concerns, please call the office at [phone].  Shower and wash the entire body with soap and water the day of your surgery paying special attention to cleansing at and around the planned surgery site.  Avoid aspirin or aspirin containing products at least fourteen (14) days prior to your surgical procedure and for at least one week (7 Days) after your surgical procedure. If you take aspirin on a regular basis for heart disease or history of stroke or for any other reason,  we may recommend you continue taking aspirin but please notify us if you take this on a regular basis. Aspirin can cause more bleeding to occur during surgery as well as prolonged bleeding and bruising after surgery.   Avoid other nonsteroidal pain medications at least one week prior to surgery and at least one week prior to your surgery. These include medications such as Ibuprofen (Motrin, Advil and Nuprin), Naprosyn, Voltaren, Relafen, etc. If medications are used for therapeutic reasons, please inform us as they can cause increased bleeding or prolonged bleeding during and bruising after surgical procedures.   Please advise [place] if you are taking any "blood thinner" medications such as Coumadin or Dipyridamole or Plavix or similar medications. These cause increased bleeding and prolonged bleeding during procedures and bruising after surgical procedures. We may have to consider discontinuing these medications briefly prior to and shortly after your surgery if safe to do so.   Please inform  us of all medications you are currently taking. All medications that are taken regularly should be taken the day of surgery as you always do. Nevertheless, we need to be informed of what medications you are taking prior to surgery to know whether they will affect the procedure or cause any complications.   Please inform us of any medication allergies. Also inform us of whether you have allergies to Latex or rubber products or whether you have had any adverse reaction to Lidocaine or Epinephrine.  Please inform us of any prosthetic or artificial body parts such as artificial heart valve, joint replacements, etc., or similar condition that might require preoperative antibiotics.   We recommend avoidance of alcohol at least two weeks prior to surgery and continued avoidance for at least two weeks after surgery.   We recommend discontinuation of tobacco smoking at least two weeks prior to surgery and continued abstinence  for at least two weeks after surgery.  Do not plan strenuous exercise, strenuous work or strenuous lifting for approximately four weeks after your surgery.   We request if you are unable to make your scheduled surgical appointment, please call us at least a week in advance or as soon as you are aware of a problem so that we can cancel or reschedule the appointment.   You MAY TAKE TYLENOL (acetaminophen) for pain as it is not a blood thinner.   PLEASE PLAN TO BE IN TOWN FOR TWO WEEKS FOLLOWING SURGERY, THIS IS IMPORTANT SO YOU CAN BE CHECKED FOR DRESSING CHANGES, SUTURE REMOVAL AND TO MONITOR FOR POSSIBLE COMPLICATIONS.

## 2021-07-22 NOTE — Progress Notes (Signed)
Follow-Up Visit   Subjective  Sharon Mckenzie is a 50 y.o. female who presents for the following: Annual Exam (Scaly lesion of concern at left frontal hair line. Would like to schedule cyst excision, left chest. Cancelled last year due to husband dx with covid. No hx of skin cancer or DN. Grandparents had h/o skin cancer, unsure of type.).  The patient presents for Total-Body Skin Exam (TBSE) for skin cancer screening and mole check.   The following portions of the chart were reviewed this encounter and updated as appropriate:  Tobacco  Allergies  Meds  Problems  Med Hx  Surg Hx  Fam Hx      Review of Systems: No other skin or systemic complaints except as noted in HPI or Assessment and Plan.   Objective  Well appearing patient in no apparent distress; mood and affect are within normal limits.  A full examination was performed including scalp, head, eyes, ears, nose, lips, neck, chest, axillae, abdomen, back, buttocks, bilateral upper extremities, bilateral lower extremities, hands, feet, fingers, toes, fingernails, and toenails. All findings within normal limits unless otherwise noted below.  Left lateral breast 8 o'clock 1.2 cm Subcutaneous nodule.   left frontal hair line Stuck-on, waxy, tan-brown papule --Discussed benign etiology and prognosis.   Assessment & Plan  Epidermal inclusion cyst Left lateral breast 8 o'clock  Benign-appearing. Exam most consistent with an epidermal inclusion cyst. Discussed that a cyst is a benign growth that can grow over time and sometimes get irritated or inflamed. Recommend observation if it is not bothersome. Discussed option of surgical excision to remove it if it is growing, symptomatic, or other changes noted. Please call for new or changing lesions so they can be evaluated.  RTC for excision.    Seborrheic keratosis left frontal hair line  Reassured benign age-related growth.  Recommend observation.  Discussed cryotherapy if  spot(s) become irritated or inflamed.  Lentigines - Scattered tan macules - Due to sun exposure - Benign-appearing, observe - Recommend daily broad spectrum sunscreen SPF 30+ to sun-exposed areas, reapply every 2 hours as needed. - Call for any changes  Seborrheic Keratoses - Stuck-on, waxy, tan-brown papules and/or plaques  - Benign-appearing - Discussed benign etiology and prognosis. - Observe - Call for any changes  Melanocytic Nevi - Tan-brown and/or pink-flesh-colored symmetric macules and papules - Benign appearing on exam today - Observation - Call clinic for new or changing moles - Recommend daily use of broad spectrum spf 30+ sunscreen to sun-exposed areas.   Hemangiomas - Red papules - Discussed benign nature - Observe - Call for any changes  Actinic Damage - Chronic condition, secondary to cumulative UV/sun exposure - diffuse scaly erythematous macules with underlying dyspigmentation - Recommend daily broad spectrum sunscreen SPF 30+ to sun-exposed areas, reapply every 2 hours as needed.  - Staying in the shade or wearing long sleeves, sun glasses (UVA+UVB protection) and wide brim hats (4-inch brim around the entire circumference of the hat) are also recommended for sun protection.  - Call for new or changing lesions.  Skin cancer screening performed today.  Dermatofibroma - Firm pink/brown papulenodule with dimple sign at right buttock. - Benign appearing - Call for any changes   Return for TBSE 2 years. Return for excision of cyst next available.  I, Emelia Salisbury, CMA, am acting as scribe for Forest Gleason, MD.  Documentation: I have reviewed the above documentation for accuracy and completeness, and I agree with the above.  Forest Gleason, MD

## 2021-08-05 LAB — HM COLONOSCOPY

## 2021-08-25 ENCOUNTER — Ambulatory Visit: Payer: 59 | Admitting: Dermatology

## 2021-08-25 ENCOUNTER — Other Ambulatory Visit: Payer: Self-pay

## 2021-08-25 DIAGNOSIS — L720 Epidermal cyst: Secondary | ICD-10-CM | POA: Diagnosis not present

## 2021-08-25 DIAGNOSIS — D485 Neoplasm of uncertain behavior of skin: Secondary | ICD-10-CM

## 2021-08-25 MED ORDER — MUPIROCIN 2 % EX OINT
1.0000 | TOPICAL_OINTMENT | Freq: Every day | CUTANEOUS | 0 refills | Status: DC
Start: 2021-08-25 — End: 2022-01-28

## 2021-08-25 NOTE — Progress Notes (Signed)
   Follow-Up Visit   Subjective  Sharon Mckenzie is a 50 y.o. female who presents for the following: Procedure (Patient here today for excision of cyst at left chest. ).  The following portions of the chart were reviewed this encounter and updated as appropriate:   Tobacco  Allergies  Meds  Problems  Med Hx  Surg Hx  Fam Hx      Review of Systems:  No other skin or systemic complaints except as noted in HPI or Assessment and Plan.  Objective  Well appearing patient in no apparent distress; mood and affect are within normal limits.  A focused examination was performed including chest. Relevant physical exam findings are noted in the Assessment and Plan.  Left Inferomedial Breast 1.2 cm firm subcutaneous nodule   Assessment & Plan  Neoplasm of uncertain behavior of skin Left Inferomedial Breast  Skin excision  Lesion length (cm):  1.2 Total excision diameter (cm):  1.2 Informed consent: discussed and consent obtained   Timeout: patient name, date of birth, surgical site, and procedure verified   Procedure prep:  Patient was prepped and draped in usual sterile fashion Prep type:  Chlorhexidine Anesthesia: the lesion was anesthetized in a standard fashion   Anesthetic:  1% lidocaine w/ epinephrine 1-100,000 buffered w/ 8.4% NaHCO3 (3cc lido w/epi, 3cc 0.25% bupivicaine) Instrument used comment:  15c Hemostasis achieved with: suture, pressure and electrodesiccation   Outcome: patient tolerated procedure well with no complications   Post-procedure details: wound care instructions given   Additional details:  Mupirocin and a pressure dressing applied  Skin repair Complexity:  Intermediate Final length (cm):  1.8 Informed consent: discussed and consent obtained   Timeout: patient name, date of birth, surgical site, and procedure verified   Procedure prep:  Patient was prepped and draped in usual sterile fashion Prep type:  Chlorhexidine Anesthesia: the lesion was  anesthetized in a standard fashion   Anesthetic:  1% lidocaine w/ epinephrine 1-100,000 local infiltration Reason for type of repair: reduce tension to allow closure, reduce the risk of dehiscence, infection, and necrosis and enhance both functionality and cosmetic results   Undermining: edges undermined   Subcutaneous layers (deep stitches):  Suture size:  4-0 Suture type: Vicryl (polyglactin 910)   Stitches:  Buried vertical mattress Fine/surface layer approximation (top stitches):  Suture size:  4-0 Suture type: nylon   Suture removal (days):  7 Hemostasis achieved with: suture, pressure and electrodesiccation Outcome: patient tolerated procedure well with no complications   Post-procedure details: wound care instructions given   Additional details:  Mupirocin and a pressure bandage applied   mupirocin ointment (BACTROBAN) 2 % Apply 1 application topically daily. With dressing changes  Specimen 1 - Surgical pathology Differential Diagnosis: r/o Cyst vs Other  Check Margins: No 1.2 cm firm subcutaneous nodule  Return in about 1 week (around 09/01/2021) for Suture Removal.  Graciella Belton, RMA, am acting as scribe for Forest Gleason, MD .  Documentation: I have reviewed the above documentation for accuracy and completeness, and I agree with the above.  Forest Gleason, MD

## 2021-08-25 NOTE — Patient Instructions (Signed)
Wound Care Instructions  Cleanse wound gently with soap and water once a day then pat dry with clean gauze. Apply a thing coat of Petrolatum (petroleum jelly, "Vaseline") over the wound (unless you have an allergy to this). We recommend that you use a new, sterile tube of Vaseline. Do not pick or remove scabs. Do not remove the yellow or white "healing tissue" from the base of the wound.  Cover the wound with fresh, clean, nonstick gauze and secure with paper tape. You may use Band-Aids in place of gauze and tape if the would is small enough, but would recommend trimming much of the tape off as there is often too much. Sometimes Band-Aids can irritate the skin.  You should call the office for your biopsy report after 1 week if you have not already been contacted.  If you experience any problems, such as abnormal amounts of bleeding, swelling, significant bruising, significant pain, or evidence of infection, please call the office immediately.  FOR ADULT SURGERY PATIENTS: If you need something for pain relief you may take 1 extra strength Tylenol (acetaminophen) AND 2 Ibuprofen (200mg each) together every 4 hours as needed for pain. (do not take these if you are allergic to them or if you have a reason you should not take them.) Typically, you may only need pain medication for 1 to 3 days.   If you have any questions or concerns for your doctor, please call our main line at [phone] and press option 4 to reach your doctor's medical assistant. If no one answers, please leave a voicemail as directed and we will return your call as soon as possible. Messages left after 4 pm will be answered the following business day.   You may also send us a message via MyChart. We typically respond to MyChart messages within 1-2 business days.  For prescription refills, please ask your pharmacy to contact our office. Our fax number is [phone].  If you have an urgent issue when the clinic is closed that  cannot wait until the next business day, you can page your doctor at the number below.    Please note that while we do our best to be available for urgent issues outside of office hours, we are not available 24/7.   If you have an urgent issue and are unable to reach us, you may choose to seek medical care at your doctor's office, retail clinic, urgent care center, or emergency room.  If you have a medical emergency, please immediately call 911 or go to the emergency department.  Pager Numbers  - Dr. Kowalski: [phone]  - Dr. Moye: [phone]  - Dr. Stewart: [phone]  In the event of inclement weather, please call our main line at [phone] for an update on the status of any delays or closures.  Dermatology Medication Tips: Please keep the boxes that topical medications come in in order to help keep track of the instructions about where and how to use these. Pharmacies typically print the medication instructions only on the boxes and not directly on the medication tubes.   If your medication is too expensive, please contact our office at [phone] option 4 or send us a message through MyChart.   We are unable to tell what your co-pay for medications will be in advance as this is different depending on your insurance coverage. However, we may be able to find a substitute medication at lower cost or fill out paperwork to get insurance to cover a needed   medication.   If a prior authorization is required to get your medication covered by your insurance company, please allow us 1-2 business days to complete this process.  Drug prices often vary depending on where the prescription is filled and some pharmacies may offer cheaper prices.  The website www.goodrx.com contains coupons for medications through different pharmacies. The prices here do not account for what the cost may be with help from insurance (it may be cheaper with your insurance), but the website can give you the  price if you did not use any insurance.  - You can print the associated coupon and take it with your prescription to the pharmacy.  - You may also stop by our office during regular business hours and pick up a GoodRx coupon card.  - If you need your prescription sent electronically to a different pharmacy, notify our office through [place] MyChart or by phone at [phone] option 4.   

## 2021-08-26 ENCOUNTER — Encounter: Payer: Self-pay | Admitting: Dermatology

## 2021-08-26 ENCOUNTER — Telehealth: Payer: Self-pay

## 2021-08-26 NOTE — Telephone Encounter (Signed)
Patient doing well after yesterday's surgery.Sharon Mckenzie

## 2021-09-01 ENCOUNTER — Ambulatory Visit: Payer: 59

## 2021-09-01 ENCOUNTER — Other Ambulatory Visit: Payer: Self-pay

## 2021-09-01 DIAGNOSIS — L720 Epidermal cyst: Secondary | ICD-10-CM

## 2021-09-01 NOTE — Progress Notes (Signed)
   Follow-Up Visit   Subjective  Missy THECLA FORGIONE is a 50 y.o. female who presents for the following: Cyst (Post op of left inferomedial breast).    The following portions of the chart were reviewed this encounter and updated as appropriate:       Review of Systems:  No other skin or systemic complaints except as noted in HPI or Assessment and Plan.  Objective  Well appearing patient in no apparent distress; mood and affect are within normal limits.  A focused examination was performed including left breast. Relevant physical exam findings are noted in the Assessment and Plan.  Left inferomedial breast Healing excision site    Assessment & Plan  Epidermal inclusion cyst Left inferomedial breast  Encounter for Removal of Sutures - Incision site at the left inferomedial breast is clean, dry and intact - Wound cleansed, sutures removed, wound cleansed and steri strips applied.  - Discussed pathology results showing cyst  - Patient advised to keep steri-strips dry until they fall off. - Scars remodel for a full year. - Once steri-strips fall off, patient can apply over-the-counter silicone scar cream each night to help with scar remodeling if desired. - Patient advised to call with any concerns or if they notice any new or changing lesions.    Return if symptoms worsen or fail to improve.

## 2021-09-01 NOTE — Patient Instructions (Signed)

## 2022-01-28 ENCOUNTER — Encounter: Payer: Self-pay | Admitting: Internal Medicine

## 2022-01-28 ENCOUNTER — Ambulatory Visit: Payer: 59 | Admitting: Internal Medicine

## 2022-01-28 VITALS — BP 116/72 | HR 84 | Temp 98.8°F | Ht 66.0 in | Wt 208.0 lb

## 2022-01-28 DIAGNOSIS — Z0000 Encounter for general adult medical examination without abnormal findings: Secondary | ICD-10-CM | POA: Diagnosis not present

## 2022-01-28 DIAGNOSIS — Z1159 Encounter for screening for other viral diseases: Secondary | ICD-10-CM | POA: Insufficient documentation

## 2022-01-28 DIAGNOSIS — E559 Vitamin D deficiency, unspecified: Secondary | ICD-10-CM | POA: Diagnosis not present

## 2022-01-28 LAB — LIPID PANEL
Cholesterol: 215 mg/dL — ABNORMAL HIGH (ref 0–200)
HDL: 49.4 mg/dL (ref >39.00)
LDL Cholesterol: 131 mg/dL — ABNORMAL HIGH (ref 0–99)
NonHDL: 165.15
Total CHOL/HDL Ratio: 4
Triglycerides: 172 mg/dL — ABNORMAL HIGH (ref 0.0–149.0)
VLDL: 34.4 mg/dL (ref 0.0–40.0)

## 2022-01-28 LAB — VITAMIN D 25 HYDROXY (VIT D DEFICIENCY, FRACTURES): VITD: 29.82 ng/mL — ABNORMAL LOW (ref 30.00–100.00)

## 2022-01-28 MED ORDER — CHOLECALCIFEROL 50 MCG (2000 UT) PO TABS: 1.0000 | ORAL_TABLET | Freq: Every day | ORAL | 3 refills | Status: DC

## 2022-01-28 NOTE — Progress Notes (Signed)
? ?Subjective:  ?Patient ID: Sharon Mckenzie, female    DOB: 06-Feb-1971  Age: 51 y.o. MRN: [id number] ? ?CC: Annual Exam ? ? ?HPI ?Sharon Mckenzie presents for a CPX and to establish. ? ?She walks about 10,000 steps a day and does not experience chest pain, shortness of breath, diaphoresis, or edema.  She has intentionally lost weight with lifestyle modifications. ? ?History ?Sharon Mckenzie has a past medical history of Asthma and Seasonal allergies.  ? ?She has a past surgical history that includes Cholecystectomy.  ? ?Her family history includes Hyperlipidemia in her father and mother; Hypertension in her father and mother; Ulcerative colitis in her brother.She reports that she has never smoked. She has never used smokeless tobacco. She reports current alcohol use of about 3.0 standard drinks per week. She reports that she does not use drugs. ? ?Outpatient Medications Prior to Visit  ?Medication Sig Dispense Refill  ? levonorgestrel (MIRENA) 20 MCG/24HR IUD 1 each by Intrauterine route once.    ? Multiple Vitamin (MULTIVITAMIN) tablet Take 1 tablet by mouth daily.    ? mupirocin ointment (BACTROBAN) 2 % Apply 1 application topically 2 (two) times daily. 22 g 0  ? mupirocin ointment (BACTROBAN) 2 % Apply 1 application topically daily. With dressing changes 22 g 0  ? ?No facility-administered medications prior to visit.  ? ? ?ROS ?Review of Systems  ?All other systems reviewed and are negative. ? ?Objective:  ?BP 116/72 (BP Location: Left Arm, Patient Position: Sitting, Cuff Size: Large)   Pulse 84   Temp 98.8 ?F (37.1 ?C) (Oral)   Ht '5\' 6"'$  (1.676 m)   Wt 208 lb (94.3 kg)   LMP 01/18/2022 (Exact Date)   SpO2 96%   BMI 33.57 kg/m?  ? ?Physical Exam ?Vitals reviewed.  ?Constitutional:   ?   Appearance: Normal appearance.  ?HENT:  ?   Mouth/Throat:  ?   Mouth: Mucous membranes are moist.  ?Eyes:  ?   General: No scleral icterus. ?   Conjunctiva/sclera: Conjunctivae normal.  ?Cardiovascular:  ?   Rate and Rhythm:  Normal rate and regular rhythm.  ?   Heart sounds: No murmur heard. ?Pulmonary:  ?   Effort: Pulmonary effort is normal.  ?   Breath sounds: No stridor. No wheezing, rhonchi or rales.  ?Abdominal:  ?   General: Abdomen is flat.  ?   Palpations: There is no mass.  ?   Tenderness: There is no abdominal tenderness. There is no guarding or rebound.  ?   Hernia: No hernia is present.  ?Musculoskeletal:  ?   Cervical back: Neck supple.  ?Lymphadenopathy:  ?   Cervical: No cervical adenopathy.  ?Skin: ?   General: Skin is warm and dry.  ?Neurological:  ?   General: No focal deficit present.  ?   Mental Status: She is alert.  ?Psychiatric:     ?   Mood and Affect: Mood normal.     ?   Behavior: Behavior normal.  ? ? ?Lab Results  ?Component Value Date  ? WBC 8.2 04/18/2019  ? HGB 14.7 04/18/2019  ? HCT 43.4 04/18/2019  ? PLT 277.0 04/18/2019  ? GLUCOSE 98 04/18/2019  ? CHOL 215 (H) 01/28/2022  ? TRIG 172.0 (H) 01/28/2022  ? HDL 49.40 01/28/2022  ? LDLDIRECT 164.6 09/13/2012  ? LDLCALC 131 (H) 01/28/2022  ? ALT 21 04/18/2019  ? AST 22 04/18/2019  ? NA 137 04/18/2019  ? K 4.4 04/18/2019  ? CL 103  04/18/2019  ? CREATININE 0.78 04/18/2019  ? BUN 12 04/18/2019  ? CO2 27 04/18/2019  ? TSH 1.98 04/18/2019  ?  ? ?Assessment & Plan:  ? ?Sharon Mckenzie was seen today for annual exam. ? ?Diagnoses and all orders for this visit: ? ?Vitamin D deficiency ?-     Cholecalciferol 50 MCG (2000 UT) TABS; Take 1 tablet (2,000 Units total) by mouth daily. ? ?Routine general medical examination at a health care facility- Exam completed, labs reviewed; statin therapy is not indicated, vaccines are up-to-date, cancer screenings are up-to-date, patient education was given. ?-     Lipid panel; Future ?-     VITAMIN D 25 Hydroxy (Vit-D Deficiency, Fractures); Future ?-     HIV Antibody (routine testing w rflx); Future ?-     Hepatitis C antibody; Future ?-     Hepatitis C antibody ?-     HIV Antibody (routine testing w rflx) ?-     VITAMIN D 25 Hydroxy  (Vit-D Deficiency, Fractures) ?-     Lipid panel ? ?Need for hepatitis C screening test ?-     Hepatitis C antibody; Future ?-     Hepatitis C antibody ? ? ?I have discontinued Sharon Mckenzie "Missy"'s mupirocin ointment and mupirocin ointment. I am also having her start on Cholecalciferol. Additionally, I am having her maintain her multivitamin and levonorgestrel. ? ?Meds ordered this encounter  ?Medications  ? Cholecalciferol 50 MCG (2000 UT) TABS  ?  Sig: Take 1 tablet (2,000 Units total) by mouth daily.  ?  Dispense:  90 tablet  ?  Refill:  3  ? ? ? ?Follow-up: Return in about 6 months (around 07/30/2022). ? ?Sharon Calico, MD ?

## 2022-01-28 NOTE — Patient Instructions (Signed)

## 2022-01-29 LAB — HEPATITIS C ANTIBODY
Hepatitis C Ab: NONREACTIVE
SIGNAL TO CUT-OFF: 0.24 (ref <1.00)

## 2022-01-29 LAB — HIV ANTIBODY (ROUTINE TESTING W REFLEX): HIV 1&2 Ab, 4th Generation: NONREACTIVE

## 2022-01-30 ENCOUNTER — Encounter: Payer: Self-pay | Admitting: Internal Medicine

## 2022-03-26 ENCOUNTER — Encounter: Payer: Self-pay | Admitting: Nurse Practitioner

## 2022-03-26 ENCOUNTER — Ambulatory Visit: Payer: 59 | Admitting: Nurse Practitioner

## 2022-03-26 VITALS — BP 120/76 | HR 84 | Temp 98.3°F | Ht 65.0 in | Wt 207.0 lb

## 2022-03-26 DIAGNOSIS — T7840XA Allergy, unspecified, initial encounter: Secondary | ICD-10-CM | POA: Insufficient documentation

## 2022-03-26 MED ORDER — PREDNISONE 20 MG PO TABS: 40.0000 mg | ORAL_TABLET | Freq: Every day | ORAL | 0 refills | Status: DC

## 2022-03-26 NOTE — Progress Notes (Signed)
Established Patient Office Visit  Subjective   Patient ID: Sharon Mckenzie, female    DOB: 05/30/1971  Age: 51 y.o. MRN: [id number]  Chief Complaint  Patient presents with   Eye Problem    Symptoms initially started approximately 5 weeks ago.  She woke up out of the blue with swollen, itchy eyelids.  She was using cold compress as well as taking Benadryl, Allegra, and Pataday eyedrops.  Symptoms did not seem to improve so she went and saw her eye doctor who was unable to determine etiology.  Eye doctor recommended lubricating eyedrops which the patient has been using.  She also recommended changing her Allegra to a different antihistamine, patient has started taking Xyzal.  Lastly patient started taking over-the-counter hydrocortisone cream and has been applying to the eyelid as needed to help with the itching.  Patient started noticing discharge over the last few days which has been mild.  She denies any visual changes or pain to the eyeball itself.  Patient denies any use of new soaps, detergents, other products that may be causing allergic reaction.    ROS -see HPI    Objective:     BP 120/76 (BP Location: Left Arm, Patient Position: Sitting, Cuff Size: Large)   Pulse 84   Temp 98.3 F (36.8 C) (Oral)   Ht '5\' 5"'$  (1.651 m)   Wt 207 lb (93.9 kg)   SpO2 98%   BMI 34.45 kg/m    Physical Exam Vitals reviewed.  Constitutional:      General: She is not in acute distress.    Appearance: Normal appearance.  HENT:     Head: Normocephalic and atraumatic.  Eyes:     General: No scleral icterus.       Right eye: Discharge present. No foreign body.        Left eye: Discharge present.No foreign body.     Conjunctiva/sclera: Conjunctivae normal.     Right eye: Right conjunctiva is not injected.     Left eye: Left conjunctiva is not injected.     Comments: Eyelid swollen with redness noted as well as some dry spots.  Neck:     Vascular: No carotid bruit.  Cardiovascular:      Rate and Rhythm: Normal rate and regular rhythm.     Pulses: Normal pulses.     Heart sounds: Normal heart sounds.  Pulmonary:     Effort: Pulmonary effort is normal.     Breath sounds: Normal breath sounds.  Skin:    General: Skin is warm and dry.  Neurological:     General: No focal deficit present.     Mental Status: She is alert and oriented to person, place, and time.  Psychiatric:        Mood and Affect: Mood normal.        Behavior: Behavior normal.        Judgment: Judgment normal.      No results found for any visits on 03/26/22.    The 10-year ASCVD risk score (Arnett DK, et al., 2019) is: 1.2%    Assessment & Plan:   Problem List Items Addressed This Visit       Other   Allergic reaction - Primary    Etiology unclear, symptoms are stable with mild improvement over the last few days.  We will treat this as if it is an allergic reaction as history and physical exam are consistent with that.  I did consult with my supervising physician  regarding this case.  Plan is to have patient take prednisone 40 mg daily x5 days, and to follow-up if symptoms persist or do not improve.  Patient was warned that if she experiences any visual changes or pain to her eyes she should notify us or her eye doctor right away.  She was told she can use hydrocortisone cream sparingly on her eyelid and for only short duration. She reports her understanding.      Relevant Medications   predniSONE (DELTASONE) 20 MG tablet    Return if symptoms worsen or fail to improve.    Ailene Ards, NP

## 2022-03-26 NOTE — Assessment & Plan Note (Signed)
Etiology unclear, symptoms are stable with mild improvement over the last few days.  We will treat this as if it is an allergic reaction as history and physical exam are consistent with that.  I did consult with my supervising physician regarding this case.  Plan is to have patient take prednisone 40 mg daily x5 days, and to follow-up if symptoms persist or do not improve.  Patient was warned that if she experiences any visual changes or pain to her eyes she should notify us or her eye doctor right away.  She was told she can use hydrocortisone cream sparingly on her eyelid and for only short duration. She reports her understanding.

## 2022-04-14 ENCOUNTER — Ambulatory Visit: Payer: 59 | Admitting: Internal Medicine

## 2022-04-27 ENCOUNTER — Ambulatory Visit: Payer: 59 | Admitting: Dermatology

## 2022-04-27 ENCOUNTER — Encounter: Payer: Self-pay | Admitting: Dermatology

## 2022-04-27 DIAGNOSIS — H01134 Eczematous dermatitis of left upper eyelid: Secondary | ICD-10-CM | POA: Diagnosis not present

## 2022-04-27 DIAGNOSIS — H01132 Eczematous dermatitis of right lower eyelid: Secondary | ICD-10-CM | POA: Diagnosis not present

## 2022-04-27 DIAGNOSIS — H01131 Eczematous dermatitis of right upper eyelid: Secondary | ICD-10-CM | POA: Diagnosis not present

## 2022-04-27 DIAGNOSIS — H01135 Eczematous dermatitis of left lower eyelid: Secondary | ICD-10-CM | POA: Diagnosis not present

## 2022-04-27 MED ORDER — OPZELURA 1.5 % EX CREA: TOPICAL_CREAM | CUTANEOUS | 1 refills | Status: DC

## 2022-04-27 NOTE — Patient Instructions (Signed)
Start Opzelura cream twice daily  Call back in 2 weeks if not improving. Will schedule patch testing.    Gentle Skin Care Guide  1. Bathe no more than once a day.  2. Avoid bathing in hot water  3. Use a mild soap like Dove, Vanicream, Cetaphil, CeraVe. Can use Lever 2000 or Cetaphil antibacterial soap  4. Use soap only where you need it. On most days, use it under your arms, between your legs, and on your feet. Let the water rinse other areas unless visibly dirty.  5. When you get out of the bath/shower, use a towel to gently blot your skin dry, don't rub it.  6. While your skin is still a little damp, apply a moisturizing cream such as Vanicream, CeraVe, Cetaphil, Eucerin, Sarna lotion or plain Vaseline Jelly. For hands apply Neutrogena [place] Hand Cream or Excipial Hand Cream.  7. Reapply moisturizer any time you start to itch or feel dry.  8. Sometimes using free and clear laundry detergents can be helpful. Fabric softener sheets should be avoided. Downy Free & Gentle liquid, or any liquid fabric softener that is free of dyes and perfumes, it acceptable to use  9. If your doctor has given you prescription creams you may apply moisturizers over them      Due to recent changes in healthcare laws, you may see results of your pathology and/or laboratory studies on MyChart before the doctors have had a chance to review them. We understand that in some cases there may be results that are confusing or concerning to you. Please understand that not all results are received at the same time and often the doctors may need to interpret multiple results in order to provide you with the best plan of care or course of treatment. Therefore, we ask that you please give [place] 2 business days to thoroughly review all your results before contacting the office for clarification. Should we see a critical lab result, you will be contacted sooner.   If You Need Anything After Your Visit  If you have any  questions or concerns for your doctor, please call our main line at [phone] and press option 4 to reach your doctor's medical assistant. If no one answers, please leave a voicemail as directed and we will return your call as soon as possible. Messages left after 4 pm will be answered the following business day.   You may also send [place] a message via [place]. We typically respond to MyChart messages within 1-2 business days.  For prescription refills, please ask your pharmacy to contact our office. Our fax number is [phone].  If you have an urgent issue when the clinic is closed that cannot wait until the next business day, you can page your doctor at the number below.    Please note that while we do our best to be available for urgent issues outside of office hours, we are not available 24/7.   If you have an urgent issue and are unable to reach [place], you may choose to seek medical care at your doctor's office, retail clinic, urgent care center, or emergency room.  If you have a medical emergency, please immediately call 911 or go to the emergency department.  Pager Numbers  - Dr. Nehemiah Massed: [phone]  - Dr. Laurence Ferrari: [phone]  - Dr. Nicole Kindred: [phone]  In the event of inclement weather, please call our main line at [phone] for an update on the status of any delays or closures.  Dermatology  Medication Tips: Please keep the boxes that topical medications come in in order to help keep track of the instructions about where and how to use these. Pharmacies typically print the medication instructions only on the boxes and not directly on the medication tubes.   If your medication is too expensive, please contact our office at [phone] option 4 or send [place] a message through [place].   We are unable to tell what your co-pay for medications will be in advance as this is different depending on your insurance coverage. However, we may be able to find a substitute medication at  lower cost or fill out paperwork to get insurance to cover a needed medication.   If a prior authorization is required to get your medication covered by your insurance company, please allow [place] 1-2 business days to complete this process.  Drug prices often vary depending on where the prescription is filled and some pharmacies may offer cheaper prices.  The website www.goodrx.com contains coupons for medications through different pharmacies. The prices here do not account for what the cost may be with help from insurance (it may be cheaper with your insurance), but the website can give you the price if you did not use any insurance.  - You can print the associated coupon and take it with your prescription to the pharmacy.  - You may also stop by our office during regular business hours and pick up a GoodRx coupon card.  - If you need your prescription sent electronically to a different pharmacy, notify our office through [facility] or by phone at [phone] option 4.     Si Usted Necesita Algo Despus de Su Visita  Tambin puede enviarnos un mensaje a travs de Pharmacist, community. Por lo general respondemos a los mensajes de MyChart en el transcurso de 1 a 2 das hbiles.  Para renovar recetas, por favor pida a su farmacia que se ponga en contacto con nuestra oficina. Harland Dingwall de fax es [place] [phone].  Si tiene un asunto urgente cuando la clnica est cerrada y que no puede esperar hasta el siguiente da hbil, puede llamar/localizar a su doctor(a) al nmero que aparece a continuacin.   Por favor, tenga en cuenta que aunque hacemos todo lo posible para estar disponibles para asuntos urgentes fuera del horario de [place], no estamos disponibles las 24 horas del da, los 7 das de la [place].   Si tiene un problema urgente y no puede comunicarse con nosotros, puede optar por buscar atencin mdica  en el consultorio de su doctor(a), en una clnica privada, en un centro de atencin urgente  o en una sala de emergencias.  Si tiene Engineering geologist, por favor llame inmediatamente al 911 o vaya a la sala de emergencias.  Nmeros de bper  - Dr. Nehemiah Massed: [phone]  - Dra. Moye: [phone]  - Dra. Nicole Kindred: [phone]  En caso de inclemencias del [place], por favor llame a Johnsie Kindred principal al [phone] para una actualizacin sobre el [place] de cualquier retraso o cierre.  Consejos para la medicacin en dermatologa: Por favor, guarde las cajas en las que vienen los medicamentos de uso tpico para ayudarle a seguir las instrucciones sobre dnde y cmo usarlos. Las farmacias generalmente imprimen las instrucciones del medicamento slo en las cajas y no directamente en los tubos del [place].   Si su medicamento es muy caro, por favor, pngase en contacto con Zigmund Daniel llamando al [phone] y presione la opcin 4 o envenos un mensaje  a travs de MyChart.   No podemos decirle cul ser su copago por los medicamentos por adelantado ya que esto es diferente dependiendo de la cobertura de su seguro. Sin embargo, es posible que podamos encontrar un medicamento sustituto a Electrical engineer un formulario para que el seguro cubra el medicamento que se considera necesario.   Si se requiere una autorizacin previa para que su compaa de seguros [place] su medicamento, por favor permtanos de 1 a 2 das hbiles para completar este proceso.  Los precios de los medicamentos varan con frecuencia dependiendo del Environmental consultant de dnde se surte la receta y alguna farmacias pueden ofrecer precios ms baratos.  El sitio web www.goodrx.com tiene cupones para medicamentos de Airline pilot. Los precios aqu no tienen en cuenta lo que podra costar con la ayuda del seguro (puede ser ms barato con su seguro), pero el sitio web puede darle el precio si no utiliz Research scientist (physical sciences).  - Puede imprimir el cupn correspondiente y llevarlo con su receta a la farmacia.  - Tambin  puede pasar por nuestra oficina durante el horario de atencin regular y Charity fundraiser una tarjeta de cupones de GoodRx.  - Si necesita que su receta se enve electrnicamente a una farmacia diferente, informe a nuestra oficina a travs de MyChart de [place] o por telfono llamando al [phone] y presione la opcin 4.

## 2022-04-27 NOTE — Progress Notes (Signed)
   Follow-Up Visit   Subjective  Sharon Mckenzie is a 51 y.o. female who presents for the following: Rash (Dur: 9-10 weeks. Has seen eye doctor and PCP. B/L eyelids. Has taken Benadryl, Allegra, Xyzal, oral Prednisone 40 mg daily for 5 days, did not help (in June), has used Pataday, lubricating drops eye doctor recommended and OTC HC cream. Currently using Pataday every morning and taking Xyzal. Eyelids are still red and itching. No changes with soaps, lotions, cosmetics).    The following portions of the chart were reviewed this encounter and updated as appropriate:  Tobacco  Allergies  Meds  Problems  Med Hx  Surg Hx  Fam Hx      Review of Systems: No other skin or systemic complaints except as noted in HPI or Assessment and Plan.   Objective  Well appearing patient in no apparent distress; mood and affect are within normal limits.  A focused examination was performed including face, eyelids. Relevant physical exam findings are noted in the Assessment and Plan.  B/L eyelids, periorbital Erythema with scale   Assessment & Plan  Eczematous dermatitis of upper and lower eyelids of both eyes B/L eyelids, periorbital  Allergic vs Contact vs Irritant vs other   Start Opzelura cream twice daily  Call back in 2 weeks if not improving. Will schedule patch testing if persistent.    Ruxolitinib Phosphate (OPZELURA) 1.5 % CREA - B/L eyelids, periorbital Apply twice daily to eyelids, around eyes   Return if symptoms worsen or fail to improve.  I, Emelia Salisbury, CMA, am acting as scribe for Forest Gleason, MD.  Documentation: I have reviewed the above documentation for accuracy and completeness, and I agree with the above.  Forest Gleason, MD

## 2022-04-28 ENCOUNTER — Other Ambulatory Visit: Payer: Self-pay

## 2022-04-28 DIAGNOSIS — H01131 Eczematous dermatitis of right upper eyelid: Secondary | ICD-10-CM

## 2022-04-28 MED ORDER — OPZELURA 1.5 % EX CREA: TOPICAL_CREAM | CUTANEOUS | 1 refills | Status: AC

## 2022-04-28 NOTE — Progress Notes (Signed)
[place] reached out to let us know Opzelura needs to go to [facility]. Medication change made.

## 2022-05-02 ENCOUNTER — Encounter: Payer: Self-pay | Admitting: Dermatology

## 2022-07-21 ENCOUNTER — Other Ambulatory Visit: Payer: Self-pay | Admitting: Internal Medicine

## 2022-07-21 DIAGNOSIS — Z1231 Encounter for screening mammogram for malignant neoplasm of breast: Secondary | ICD-10-CM

## 2022-08-26 DIAGNOSIS — Z1231 Encounter for screening mammogram for malignant neoplasm of breast: Secondary | ICD-10-CM

## 2022-09-01 ENCOUNTER — Ambulatory Visit
Admission: RE | Admit: 2022-09-01 | Discharge: 2022-09-01 | Disposition: A | Discharge status: Home / Self Care | Payer: 59 | Source: Ambulatory Visit | Attending: Internal Medicine | Admitting: Internal Medicine

## 2022-09-01 DIAGNOSIS — Z1231 Encounter for screening mammogram for malignant neoplasm of breast: Secondary | ICD-10-CM

## 2022-12-03 ENCOUNTER — Other Ambulatory Visit: Payer: Self-pay | Admitting: Nurse Practitioner

## 2022-12-03 ENCOUNTER — Other Ambulatory Visit (HOSPITAL_COMMUNITY)
Admission: RE | Admit: 2022-12-03 | Discharge: 2022-12-03 | Disposition: A | Discharge status: Home / Self Care | Payer: 59 | Source: Ambulatory Visit | Attending: Nurse Practitioner | Admitting: Nurse Practitioner

## 2022-12-03 DIAGNOSIS — Z124 Encounter for screening for malignant neoplasm of cervix: Secondary | ICD-10-CM | POA: Insufficient documentation

## 2022-12-07 LAB — CYTOLOGY - PAP
Comment: NEGATIVE
Diagnosis: NEGATIVE
High risk HPV: NEGATIVE

## 2023-07-19 ENCOUNTER — Other Ambulatory Visit: Payer: Self-pay | Admitting: Internal Medicine

## 2023-07-19 DIAGNOSIS — Z1231 Encounter for screening mammogram for malignant neoplasm of breast: Secondary | ICD-10-CM

## 2023-08-18 ENCOUNTER — Encounter: Payer: 59 | Admitting: Internal Medicine

## 2023-08-22 ENCOUNTER — Encounter: Payer: 59 | Admitting: Internal Medicine

## 2023-08-29 ENCOUNTER — Encounter: Payer: Self-pay | Admitting: Internal Medicine

## 2023-08-29 ENCOUNTER — Ambulatory Visit (INDEPENDENT_AMBULATORY_CARE_PROVIDER_SITE_OTHER): Payer: 59 | Admitting: Internal Medicine

## 2023-08-29 VITALS — BP 150/92 | HR 87 | Temp 98.1°F | Ht 65.0 in | Wt 220.8 lb

## 2023-08-29 DIAGNOSIS — R0681 Apnea, not elsewhere classified: Secondary | ICD-10-CM | POA: Diagnosis not present

## 2023-08-29 DIAGNOSIS — E785 Hyperlipidemia, unspecified: Secondary | ICD-10-CM

## 2023-08-29 DIAGNOSIS — I10 Essential (primary) hypertension: Secondary | ICD-10-CM | POA: Diagnosis not present

## 2023-08-29 DIAGNOSIS — Z0000 Encounter for general adult medical examination without abnormal findings: Secondary | ICD-10-CM | POA: Diagnosis not present

## 2023-08-29 DIAGNOSIS — Z0001 Encounter for general adult medical examination with abnormal findings: Secondary | ICD-10-CM

## 2023-08-29 NOTE — Progress Notes (Unsigned)
Subjective:  Patient ID: Sharon Mckenzie, female    DOB: 1971-03-24  Age: 52 y.o. MRN: [id number]  CC: Annual Exam, Hypertension, and Hyperlipidemia   HPI Sharon Mckenzie presents for a CPX and f/up ---  She walks several miles/week and has good endurance. She feels stressed form work/travel but denies DOE, CP, SOB, edema. He husband has witnessed apnea and she has non-restorative sleep and weight gain.  Outpatient Medications Prior to Visit  Medication Sig Dispense Refill   levonorgestrel (MIRENA) 20 MCG/24HR IUD 1 each by Intrauterine route once.     Multiple Vitamin (MULTIVITAMIN) tablet Take 1 tablet by mouth daily.     Ruxolitinib Phosphate (OPZELURA) 1.5 % CREA Apply twice daily to eyelids, around eyes 60 g 1   Cholecalciferol 50 MCG (2000 UT) TABS Take 1 tablet (2,000 Units total) by mouth daily. 90 tablet 3   predniSONE (DELTASONE) 20 MG tablet Take 2 tablets (40 mg total) by mouth daily with breakfast. 10 tablet 0   No facility-administered medications prior to visit.    ROS Review of Systems  Constitutional:  Positive for fatigue and unexpected weight change (wt gain). Negative for appetite change, chills, diaphoresis and fever.  HENT: Negative.    Eyes:  Negative for visual disturbance.  Respiratory:  Positive for apnea. Negative for cough, chest tightness, shortness of breath and wheezing.   Cardiovascular:  Negative for chest pain, palpitations and leg swelling.  Gastrointestinal:  Negative for abdominal pain, constipation, diarrhea, nausea and vomiting.  Genitourinary: Negative.  Negative for difficulty urinating, dysuria and hematuria.  Musculoskeletal: Negative.  Negative for arthralgias.  Skin: Negative.   Neurological:  Negative for dizziness and weakness.  Hematological:  Negative for adenopathy. Does not bruise/bleed easily.  Psychiatric/Behavioral: Negative.      Objective:  BP (!) 150/92 (BP Location: Left Arm)   Pulse 87   Temp 98.1 F (36.7 C)  (Oral)   Ht 5\' 5"  (1.651 m)   Wt 220 lb 12.8 oz (100.2 kg)   LMP  (LMP Unknown)   SpO2 98%   BMI 36.74 kg/m   BP Readings from Last 3 Encounters:  08/29/23 (!) 150/92  03/26/22 120/76  01/28/22 116/72    Wt Readings from Last 3 Encounters:  08/29/23 220 lb 12.8 oz (100.2 kg)  03/26/22 207 lb (93.9 kg)  01/28/22 208 lb (94.3 kg)    Physical Exam Vitals reviewed.  HENT:     Mouth/Throat:     Mouth: Mucous membranes are moist.  Eyes:     General: No scleral icterus.    Conjunctiva/sclera: Conjunctivae normal.  Cardiovascular:     Rate and Rhythm: Normal rate and regular rhythm.     Heart sounds: No murmur heard.    No friction rub. No gallop.     Comments: EKG ---- NSR, 71 bpm LAD No LVH, Q waves, or ST/T waves  Pulmonary:     Breath sounds: No stridor. No wheezing, rhonchi or rales.  Abdominal:     General: Abdomen is flat.     Palpations: There is no mass.     Tenderness: There is no abdominal tenderness. There is no guarding.     Hernia: No hernia is present.  Musculoskeletal:        General: Normal range of motion.     Cervical back: Neck supple.     Right lower leg: No edema.     Left lower leg: No edema.  Lymphadenopathy:     Cervical: No  cervical adenopathy.  Skin:    General: Skin is warm and dry.     Findings: No rash.  Neurological:     General: No focal deficit present.     Mental Status: She is alert. Mental status is at baseline.  Psychiatric:        Mood and Affect: Mood normal.        Behavior: Behavior normal.     Lab Results  Component Value Date   WBC 8.2 04/18/2019   HGB 14.7 04/18/2019   HCT 43.4 04/18/2019   PLT 277.0 04/18/2019   GLUCOSE 98 04/18/2019   CHOL 215 (H) 01/28/2022   TRIG 172.0 (H) 01/28/2022   HDL 49.40 01/28/2022   LDLDIRECT 164.6 09/13/2012   LDLCALC 131 (H) 01/28/2022   ALT 21 04/18/2019   AST 22 04/18/2019   NA 137 04/18/2019   K 4.4 04/18/2019   CL 103 04/18/2019   CREATININE 0.78 04/18/2019   BUN 12  04/18/2019   CO2 27 04/18/2019   TSH 1.98 04/18/2019    No results found.  Assessment & Plan:  Hyperlipidemia, unspecified hyperlipidemia type -     Lipid panel; Future -     TSH; Future -     Hepatic function panel; Future  Encounter for general adult medical examination with abnormal findings  Primary hypertension -     TSH; Future -     Urinalysis, Routine w reflex microscopic; Future -     Hepatic function panel; Future -     Basic metabolic panel; Future -     CBC with Differential/Platelet; Future -     EKG 12-Lead -     Aldosterone + renin activity w/ ratio; Future  Witnessed episode of apnea -     Ambulatory referral to Sleep Studies     Follow-up: Return in about 3 months (around 11/29/2023).  Sanda Linger, MD

## 2023-08-29 NOTE — Patient Instructions (Signed)

## 2023-08-30 LAB — URINALYSIS, ROUTINE W REFLEX MICROSCOPIC
Bilirubin Urine: NEGATIVE
Ketones, ur: NEGATIVE
Leukocytes,Ua: NEGATIVE
Nitrite: NEGATIVE
Specific Gravity, Urine: 1.02 (ref 1.000–1.030)
Total Protein, Urine: NEGATIVE
Urine Glucose: NEGATIVE
Urobilinogen, UA: 1 (ref 0.0–1.0)
pH: 6.5 (ref 5.0–8.0)

## 2023-08-30 LAB — CBC WITH DIFFERENTIAL/PLATELET
Basophils Absolute: 0.1 10*3/uL (ref 0.0–0.1)
Basophils Relative: 1.5 % (ref 0.0–3.0)
Eosinophils Absolute: 0.4 10*3/uL (ref 0.0–0.7)
Eosinophils Relative: 4.4 % (ref 0.0–5.0)
HCT: 41.5 % (ref 36.0–46.0)
Hemoglobin: 13.8 g/dL (ref 12.0–15.0)
Lymphocytes Relative: 26.9 % (ref 12.0–46.0)
Lymphs Abs: 2.4 10*3/uL (ref 0.7–4.0)
MCHC: 33.2 g/dL (ref 30.0–36.0)
MCV: 88.8 fL (ref 78.0–100.0)
Monocytes Absolute: 0.5 10*3/uL (ref 0.1–1.0)
Monocytes Relative: 5.4 % (ref 3.0–12.0)
Neutro Abs: 5.5 10*3/uL (ref 1.4–7.7)
Neutrophils Relative %: 61.8 % (ref 43.0–77.0)
Platelets: 287 10*3/uL (ref 150.0–400.0)
RBC: 4.67 Mil/uL (ref 3.87–5.11)
RDW: 13.2 % (ref 11.5–15.5)
WBC: 9 10*3/uL (ref 4.0–10.5)

## 2023-08-30 LAB — LIPID PANEL
Cholesterol: 201 mg/dL — ABNORMAL HIGH (ref 0–200)
HDL: 49.5 mg/dL (ref >39.00)
LDL Cholesterol: 117 mg/dL — ABNORMAL HIGH (ref 0–99)
NonHDL: 151.42
Total CHOL/HDL Ratio: 4
Triglycerides: 172 mg/dL — ABNORMAL HIGH (ref 0.0–149.0)
VLDL: 34.4 mg/dL (ref 0.0–40.0)

## 2023-08-30 LAB — HEPATIC FUNCTION PANEL
ALT: 11 U/L (ref 0–35)
AST: 15 U/L (ref 0–37)
Albumin: 4.2 g/dL (ref 3.5–5.2)
Alkaline Phosphatase: 54 U/L (ref 39–117)
Bilirubin, Direct: 0 mg/dL (ref 0.0–0.3)
Total Bilirubin: 0.4 mg/dL (ref 0.2–1.2)
Total Protein: 6.9 g/dL (ref 6.0–8.3)

## 2023-08-30 LAB — BASIC METABOLIC PANEL
BUN: 14 mg/dL (ref 6–23)
CO2: 27 meq/L (ref 19–32)
Calcium: 9.5 mg/dL (ref 8.4–10.5)
Chloride: 103 meq/L (ref 96–112)
Creatinine, Ser: 0.78 mg/dL (ref 0.40–1.20)
GFR: 88.71 mL/min (ref >60.00)
Glucose, Bld: 111 mg/dL — ABNORMAL HIGH (ref 70–99)
Potassium: 4.5 meq/L (ref 3.5–5.1)
Sodium: 137 meq/L (ref 135–145)

## 2023-08-30 LAB — TSH: TSH: 2.22 u[IU]/mL (ref 0.35–5.50)

## 2023-09-02 LAB — ALDOSTERONE + RENIN ACTIVITY W/ RATIO
ALDO / PRA Ratio: 6.9 ratio (ref 0.9–28.9)
Aldosterone: 4 ng/dL
Renin Activity: 0.58 ng/mL/h (ref 0.25–5.82)

## 2023-09-03 ENCOUNTER — Other Ambulatory Visit: Payer: Self-pay | Admitting: Internal Medicine

## 2023-09-03 DIAGNOSIS — I10 Essential (primary) hypertension: Secondary | ICD-10-CM

## 2023-09-22 ENCOUNTER — Ambulatory Visit
Admission: RE | Admit: 2023-09-22 | Discharge: 2023-09-22 | Disposition: A | Discharge status: Home / Self Care | Payer: 59 | Source: Ambulatory Visit | Attending: Internal Medicine | Admitting: Internal Medicine

## 2023-09-22 DIAGNOSIS — Z1231 Encounter for screening mammogram for malignant neoplasm of breast: Secondary | ICD-10-CM

## 2023-09-26 ENCOUNTER — Other Ambulatory Visit: Payer: Self-pay | Admitting: Internal Medicine

## 2023-09-26 ENCOUNTER — Encounter: Payer: Self-pay | Admitting: Neurology

## 2023-09-26 ENCOUNTER — Ambulatory Visit: Payer: 59 | Admitting: Neurology

## 2023-09-26 ENCOUNTER — Encounter: Payer: Self-pay | Admitting: Internal Medicine

## 2023-09-26 VITALS — BP 150/74 | HR 88 | Ht 66.0 in | Wt 219.0 lb

## 2023-09-26 DIAGNOSIS — E66812 Obesity, class 2: Secondary | ICD-10-CM | POA: Diagnosis not present

## 2023-09-26 DIAGNOSIS — M2629 Other anomalies of dental arch relationship: Secondary | ICD-10-CM | POA: Diagnosis not present

## 2023-09-26 DIAGNOSIS — R928 Other abnormal and inconclusive findings on diagnostic imaging of breast: Secondary | ICD-10-CM

## 2023-09-26 DIAGNOSIS — G478 Other sleep disorders: Secondary | ICD-10-CM | POA: Insufficient documentation

## 2023-09-26 NOTE — Patient Instructions (Signed)
ASSESSMENT AND PLAN 52 y.o. year old female patient  here with:    1) non restorative sleep , restless sleep .   2)  hasn't been dreaming in a long time , waking up frequently but can go right back to sleep .  3)  sleep is fragmented, no or little  snoring , no witnessed apneas, no nocturia.    BMI is 35,  neck size was normal. Mallompatti 2. Good nasal air flow.  Had low Vit D levels.    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   HST ordered, can be mailed.     I plan to follow up either personally or through our NP within 3-5 months.

## 2023-09-26 NOTE — Progress Notes (Signed)
SLEEP MEDICINE CLINIC    Provider:  Melvyn Novas, MD  Primary Care Physician:  Etta Grandchild, MD [address] [place] [address]     Referring Provider: Etta Grandchild, Md [address] [place],  [place] [address]          Chief Complaint according to patient   Patient presents with:     New Sleep Patient (Initial Visit)           HISTORY OF PRESENT ILLNESS:  Sharon Mckenzie is a 52 y.o. female patient who is seen upon referral on 09/26/2023 from Dr Yetta Barre , MD,  for a Sleep consultation. Chief concern according to patient :  " I sleep from 9- 6.30 Am still feel never well rested. " My husband Rosalia Hammers has witnessed restless sleep and sleep talking but not apnea, there is rarely snoring.  It has been a long time since I felt refreshed in the mornings. No morning headaches, but sometimes dry mouth. I rarely dream anymore or can't recall dreaming.  About 9 months ago I had th first nocturnal hot flushes, but I have an IUD for 25 years , still having irregular periods (Gyn assumed she is perimenopausal). "    I have the pleasure of seeing Sharon Mckenzie was seen  on 09/26/23 , a right -handed Caucasian female with a possible sleep disorder.      Sleep relevant medical history: no Nocturia, no Sleep walking, no ENT surgery, no TBI.   Family medical /sleep history: no  other family member on CPAP with OSA.    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  No children. The patient currently works office hours.  Tobacco use; none .   ETOH use: rare ,  Caffeine intake in form of Coffee( 8 ounces- 12 ounces a day , AM ) Soda( /) Tea ( /) or energy drinks Exercise: walking the dog all the time.  Hobbies : science     Sleep habits are as follows: The patient's dinner time is between 6 PM. The patient goes to bed at 9  PM and continues to sleep for 8 plus  hours, woken up from back pain, or other discomfort-  changing sleep positions. Bedroom is cool, quiet, dark.  The preferred sleep position is laterally or prone, with the support of 1 pillow on a flat bed. Dreams are reportedly rare.  The patient wakes up spontaneously at 6 AM without an alarm. 6.30  AM is the usual rise time. She reports not feeling refreshed or restored in AM, with symptoms such as dry mouth but no  morning headaches, some residual fatigue.  Naps are not taken - sometimes on weekends a 10 minute nap and are refreshing .    Review of Systems: Out of a complete 14 system review, the patient complains of only the following symptoms, and all other reviewed systems are negative.:  Fatigue, sleepiness , fragmented sleep   How likely are you to doze in the following situations: 0 = not likely, 1 = slight chance, 2 = moderate chance, 3 = high chance   Sitting and Reading? Watching Television? Sitting inactive in a public place (theater or meeting)? As a passenger in a car for an hour without a break? Lying down in the afternoon when circumstances permit? Sitting  and talking to someone? Sitting quietly after lunch without alcohol? In a car, while stopped for a few minutes in traffic?   Total = 5/ 24 points   FSS endorsed at 21/ 63 points.   Social History   Socioeconomic History   Marital status: Married    Spouse name: Not on file   Number of children: Not on file   Years of education: Not on file   Highest education level: Master's degree (e.g., MA, MS, MEng, MEd, MSW, MBA)  Occupational History   Not on file  Tobacco Use   Smoking status: Never   Smokeless tobacco: Never  Substance and Sexual Activity   Alcohol use: Yes    Alcohol/week: 3.0 standard drinks of alcohol    Types: 1 Glasses of wine, 1 Cans of beer, 1 Shots of liquor per week   Drug use: No   Sexual activity: Yes    Partners: Male    Birth control/protection:  I.U.D.  Other Topics Concern   Not on file  Social History Narrative   Married, no children.   Works for [employer].   Social Drivers of Corporate investment banker Strain: Low Risk  (08/25/2023)   Overall Financial Resource Strain (CARDIA)    Difficulty of Paying Living Expenses: Not hard at all  Food Insecurity: No Food Insecurity (08/25/2023)   Hunger Vital Sign    Worried About Running Out of Food in the Last Year: Never true    Ran Out of Food in the Last Year: Never true  Transportation Needs: No Transportation Needs (08/25/2023)   PRAPARE - Administrator, Civil Service (Medical): No    Lack of Transportation (Non-Medical): No  Physical Activity: Sufficiently Active (08/25/2023)   Exercise Vital Sign    Days of Exercise per Week: 7 days    Minutes of Exercise per Session: 30 min  Stress: No Stress Concern Present (08/25/2023)   [employer] of Occupational Health - Occupational Stress Questionnaire    Feeling of Stress : Not at all  Social Connections: Moderately Isolated (08/25/2023)   Social Connection and Isolation Panel [NHANES]    Frequency of Communication with Friends and Family: More than three times a week    Frequency of Social Gatherings with Friends and Family: Twice a week    Attends Religious Services: Never    Database administrator or Organizations: No    Attends Engineer, structural: Not on file    Marital Status: Married    Family History  Problem Relation Age of Onset   Hyperlipidemia Mother    Hypertension Mother    Hypertension Father    Hyperlipidemia Father    Ulcerative colitis Brother     Past Medical History:  Diagnosis Date   Asthma    Seasonal allergies     Past Surgical History:  Procedure Laterality Date   CHOLECYSTECTOMY       Current Outpatient Medications on File Prior to Visit  Medication Sig Dispense Refill   levonorgestrel (MIRENA) 20 MCG/24HR IUD 1 each by Intrauterine route once.      Multiple Vitamin (MULTIVITAMIN) tablet Take 1 tablet by mouth daily.     Ruxolitinib Phosphate (OPZELURA) 1.5 % CREA Apply twice daily to eyelids, around eyes 60 g 1   No current facility-administered medications on file prior to visit.    No Known Allergies   DIAGNOSTIC DATA (LABS, IMAGING, TESTING) - I reviewed patient records, labs, notes, testing and imaging myself  where available.  Lab Results  Component Value Date   WBC 9.0 08/29/2023   HGB 13.8 08/29/2023   HCT 41.5 08/29/2023   MCV 88.8 08/29/2023   PLT 287.0 08/29/2023      Component Value Date/Time   NA 137 08/29/2023 1628   K 4.5 08/29/2023 1628   CL 103 08/29/2023 1628   CO2 27 08/29/2023 1628   GLUCOSE 111 (H) 08/29/2023 1628   BUN 14 08/29/2023 1628   CREATININE 0.78 08/29/2023 1628   CALCIUM 9.5 08/29/2023 1628   PROT 6.9 08/29/2023 1628   ALBUMIN 4.2 08/29/2023 1628   AST 15 08/29/2023 1628   ALT 11 08/29/2023 1628   ALKPHOS 54 08/29/2023 1628   BILITOT 0.4 08/29/2023 1628   GFRNONAA >60 11/27/2007 1548   GFRAA  11/27/2007 1548    >60        The eGFR has been calculated using the MDRD equation. This calculation has not been validated in all clinical   Lab Results  Component Value Date   CHOL 201 (H) 08/29/2023   HDL 49.50 08/29/2023   LDLCALC 117 (H) 08/29/2023   LDLDIRECT 164.6 09/13/2012   TRIG 172.0 (H) 08/29/2023   CHOLHDL 4 08/29/2023   No results found for: "HGBA1C" No results found for: "VITAMINB12" Lab Results  Component Value Date   TSH 2.22 08/29/2023    PHYSICAL EXAM:  Today's Vitals   09/26/23 1536  BP: (!) 150/74  Pulse: 88  Weight: 219 lb (99.3 kg)  Height: 5\' 6"  (1.676 m)   Body mass index is 35.35 kg/m.   Wt Readings from Last 3 Encounters:  09/26/23 219 lb (99.3 kg)  08/29/23 220 lb 12.8 oz (100.2 kg)  03/26/22 207 lb (93.9 kg)     Ht Readings from Last 3 Encounters:  09/26/23 5\' 6"  (1.676 m)  08/29/23 5\' 5"  (1.651 m)  03/26/22 5\' 5"  (1.651 m)       General: The patient is awake, alert and appears not in acute distress. The patient is well groomed. Head: Normocephalic, atraumatic.  Neck is supple. Mallampati 2,  neck circumference:14 inches .  Nasal airflow fully patent.  Retrognathia is  seen.  Overbite  Dental status: biological used to wear braces.  Cardiovascular:  Regular rate and cardiac rhythm by pulse,  without distended neck veins. Respiratory: Lungs are clear to auscultation.  Skin:  Without evidence of ankle edema, or rash. Trunk: The patient's posture is erect.   NEUROLOGIC EXAM: The patient is awake and alert, oriented to place and time.   Memory subjective described as intact.  Attention span & concentration ability appears normal.  Speech is fluent,  without  dysarthria, dysphonia or aphasia.  Mood and affect are appropriate.   Cranial nerves: no loss of smell or taste reported  Pupils are equal and briskly reactive to light. Funduscopic exam deferred.  Extraocular movements in vertical and horizontal planes were intact and without nystagmus. No Diplopia. Visual fields by finger perimetry are intact. Hearing was intact to soft voice and finger rubbing. Facial sensation intact to fine touch.  Facial motor strength is symmetric and tongue and uvula move midline.  Neck ROM : rotation, tilt and flexion extension were normal for age and shoulder shrug was symmetrical.    Motor exam:  Symmetric bulk, tone and ROM.   Normal tone without cog-wheeling, symmetric grip strength .   Sensory:  Fine touch and vibration were felt.  Proprioception tested in the upper extremities was normal.  Coordination: Rapid alternating movements in the fingers/hands were of normal speed.  The Finger-to-nose maneuver was intact without evidence of ataxia, dysmetria or tremor.   Gait and station: Patient could rise unassisted from a seated position, walked without assistive device.  Stance is of normal width/ base and the patient turned  with 3 steps.  Toe and heel walk were deferred.  Deep tendon reflexes: in the  upper and lower extremities are symmetric and intact.  Babinski response was deferred.   ASSESSMENT AND PLAN 52 y.o. year old female patient  here with:    1) non restorative sleep , restless sleep .   2)  hasn't been dreaming in a long time , waking up frequently but can go right back to sleep .  3)  sleep is fragmented, no or little  snoring , no witnessed apneas, no nocturia.    BMI is 35,  neck size was normal. Mallompatti 2. Good nasal air flow.  Had low Vit D levels.    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   HST ordered, can be mailed.     I plan to follow up either personally or through our NP within 3-5 months.   I would like to thank Etta Grandchild, MD and Etta Grandchild, Md [address] [place],  [place] [address] for allowing me to meet with and to take care of this pleasant patient.   After spending a total time of  35  minutes face to face and additional time for physical and neurologic examination, review of laboratory studies,  personal review of imaging studies, reports and results of other testing and review of referral information / records as far as provided in visit,   Electronically signed by: Melvyn Novas, MD 09/26/2023 3:57 PM  [facility] and [facility] Board certified by The [employer] of Sleep Medicine and Diplomate of the [employer] of Sleep Medicine. Board certified In Neurology through the ABPN, Fellow of the [employer] of Neurology.

## 2023-09-29 ENCOUNTER — Ambulatory Visit (INDEPENDENT_AMBULATORY_CARE_PROVIDER_SITE_OTHER): Payer: 59

## 2023-09-29 VITALS — BP 134/82

## 2023-09-29 DIAGNOSIS — Z23 Encounter for immunization: Secondary | ICD-10-CM

## 2023-09-29 NOTE — Progress Notes (Signed)
After obtaining consent, and per orders of Dr. Yetta Barre, injection of Flu shot and shingle given by Ferdie Ping. Patient also came for BP reading, reading was normal.

## 2023-10-14 ENCOUNTER — Ambulatory Visit
Admission: RE | Admit: 2023-10-14 | Discharge: 2023-10-14 | Disposition: A | Discharge status: Home / Self Care | Payer: 59 | Source: Ambulatory Visit | Attending: Internal Medicine | Admitting: Internal Medicine

## 2023-10-14 DIAGNOSIS — R928 Other abnormal and inconclusive findings on diagnostic imaging of breast: Secondary | ICD-10-CM

## 2023-10-27 ENCOUNTER — Ambulatory Visit: Payer: 59 | Admitting: Neurology

## 2023-10-27 DIAGNOSIS — M2629 Other anomalies of dental arch relationship: Secondary | ICD-10-CM

## 2023-10-27 DIAGNOSIS — G4733 Obstructive sleep apnea (adult) (pediatric): Secondary | ICD-10-CM | POA: Diagnosis not present

## 2023-10-27 DIAGNOSIS — E66812 Obesity, class 2: Secondary | ICD-10-CM

## 2023-10-27 DIAGNOSIS — G478 Other sleep disorders: Secondary | ICD-10-CM

## 2023-11-09 NOTE — Progress Notes (Signed)
[facility]  Sharon Carbon "Missy" Female, 53 y.o., [birth year] MRN: [id number]   HOME SLEEP TEST REPORT ( by [facility] mail -out test )   STUDY DATE:   DATA download was 11-09-2023 ORDERING CLINICIAN:  REFERRING CLINICIAN:  Dr Yetta Barre   CLINICAL INFORMATION/HISTORY: CARSYN BOSTER is a 53 y.o. female patient who is seen upon referral on 09/26/2023 from Dr Yetta Barre , MD,  for a Sleep consultation. Chief concern according to patient :  " I sleep from 9- 6.30 Am still feel never well rested. " My husband Rosalia Hammers has witnessed restless sleep and sleep talking but not apnea, there is rarely snoring.  It has been a long time since I felt refreshed in the mornings. No morning headaches, but sometimes dry mouth. I rarely dream anymore or can't recall dreaming.  About 9 months ago, I had the first nocturnal hot flushes, but I have an IUD for 25 years , still having irregular periods (Gyn assumed she is perimenopausal)".     Epworth sleepiness score: 5/ 24 points   FSS endorsed at 21/ 63 points.      BMI:  35.35 kg/m   Neck Circumference: 14"   FINDINGS:   Sleep Summary: The recording began on 16 January at 9 hours 48 minutes p.m.   Total Sleep Time (hours, min):      7 hours and 15 minutes with a sleep efficiency of 92%                                              Respiratory Indices:   Calculated pAHI (per hour):    Following AASM criteria, the apnea hypopnea index was 10.4/h.  A differentiation between REM sleep and non-REM sleep apnea is not possible with this device, neither is a possible to review the sleep position in relation to apnea.                         Oxygen Saturation Statistics:   Oxygen Saturation (%) Mean:    Mean oxygen saturation was 94.3%, between a nadir at 84.8% and a maximum saturation of 99.5%.           O2 Saturation (minutes) <89%: 0 minutes         Pulse Rate Statistics:   Pulse Mean (bpm):    81 bpm             Pulse Range:       Ranging from 66 through 111 beats per minute.  This home sleep test device does allow for a review of cardiac rhythm data, the patient remained in normal sinus rhythm throughout the night..           IMPRESSION:  This HST confirms the presence of mild obstructive sleep apnea.  Central events were not detected. There was no associated bradycardia or tachycardia or hypoxia of clinical significance.   RECOMMENDATION: This is mild obstructive sleep apnea that was not associated with loud snoring. Given that his reduction of the patient's more restless sleep recently and the presence of an overbite, she could treat this mild apnea with CPAP, with a dental device for mandibular advancement, or opt for weight loss as a long-term strategy.  The patient has used a bite guard in the past.  Hormone replacement therapy also can  help to reduce the menopausal sleep symptoms the patient has described, for a more restorative sleep quality.  I would leave the preference of treatment of such mild apnea up to the patient.  Please note that weight loss assisted by medication can lower the apnea degree.      INTERPRETING PHYSICIAN:   Melvyn Novas, MD   [facility] and [facility] Sleep Board certified by The [employer] of Sleep Medicine and Diplomate of the [employer] of Sleep Medicine. Board certified In Neurology through the ABPN, Fellow of the [employer] of Neurology.

## 2023-11-23 ENCOUNTER — Encounter: Payer: Self-pay | Admitting: Neurology

## 2023-11-23 DIAGNOSIS — G4733 Obstructive sleep apnea (adult) (pediatric): Secondary | ICD-10-CM

## 2023-11-23 NOTE — Procedures (Signed)
[facility]  Sharon Mckenzie "Sharon Mckenzie" Female, 53 y.o., 1971/03/17 MRN: [id number]   HOME SLEEP TEST REPORT ( by [name] mail -out test )   STUDY DATE:   DATA download was 11-09-2023 ORDERING CLINICIAN: Melvyn Novas ,MD REFERRING CLINICIAN:  Dr Sharon Barre, MD  Sharon Mckenzie PCP   CLINICAL INFORMATION/HISTORY: Sharon Mckenzie is a 53 y.o. female patient who is seen upon referral on 09/26/2023 from Dr Sharon Mckenzie , MD,  for a Sleep consultation. Chief concern according to patient :  " I sleep from 9- 6.30 Am still feel never well rested. " My husband Sharon Mckenzie has witnessed restless sleep and sleep talking but not apnea, there is rarely snoring.  It has been a long time since I felt refreshed in the mornings. No morning headaches, but sometimes dry mouth. I rarely dream anymore or can't recall dreaming.  About 9 months ago, I had the first nocturnal hot flushes, but I have an IUD for 25 years , still having irregular periods (Gyn assumed she is perimenopausal)".     Epworth sleepiness score: 5/ 24 points , FSS endorsed at 21/ 63 points.      BMI:  35.35 kg/m   Neck Circumference: 14"   FINDINGS:   Sleep Summary: The recording began on 16 January at 9 hours 48 minutes p.m.   Total Sleep Time (hours, min):      7 hours and 15 minutes with a sleep efficiency of 92%                                              Respiratory Indices:   Calculated pAHI (per hour):    Following AASM criteria, the apnea hypopnea index was 10.4/h.  A differentiation between REM sleep and non-REM sleep apnea is not possible with this device, neither is a possible to review the sleep position in relation to apnea.                         Oxygen Saturation Statistics:   Oxygen Saturation (%) Mean:    Mean oxygen saturation was 94.3%, between a nadir at 84.8% and a maximum saturation of 99.5%.           O2 Saturation (minutes) <89%: 0 minutes         Pulse Rate Statistics:   Pulse Mean (bpm):    81 bpm              Pulse Range:      Ranging from 66 through 111 beats per minute.  This home sleep test device does allow for a review of cardiac rhythm data, the patient remained in normal sinus rhythm throughout the night..           IMPRESSION:  This HST confirms the presence of mild obstructive sleep apnea.  Central events were not detected. There was no associated bradycardia or tachycardia or hypoxia of clinical significance.   RECOMMENDATION: This is mild obstructive sleep apnea that was not associated with loud snoring. Given that his reduction of the patient's more restless sleep recently and the presence of an overbite, she could treat this mild apnea with CPAP, with a dental device for mandibular advancement, or opt for weight loss as a long-term strategy.  The patient has used a bite guard in the past.  Hormone replacement therapy also can help to reduce the menopausal sleep symptoms the patient has described, for a more restorative sleep quality.  I would leave the preference of treatment of such mild apnea up to the patient.  Please note that weight loss assisted by medication can lower the apnea degree.      INTERPRETING PHYSICIAN:   Melvyn Novas, MD   [facility] and [facility] Sleep Board certified by The [employer] of Sleep Medicine and Diplomate of the [employer] of Sleep Medicine. Board certified In Neurology through the ABPN, Fellow of the [employer] of Neurology.

## 2023-11-28 ENCOUNTER — Telehealth: Payer: Self-pay

## 2023-11-28 NOTE — Telephone Encounter (Signed)
Referral sent to [facility]. (P) [phone] (F) [phone]

## 2024-01-17 NOTE — Telephone Encounter (Signed)
 Received a order to be signed and completed and returned to Dr Toni Arthurs. I was able to get Dr Frances Furbish to sign on behalf of Dr Dohmeier in her absence. Received confirmation of fax received.

## 2024-01-19 ENCOUNTER — Ambulatory Visit

## 2024-01-19 DIAGNOSIS — Z23 Encounter for immunization: Secondary | ICD-10-CM

## 2024-01-19 NOTE — Progress Notes (Signed)
 Patient visits today for their second shingrix vaccination. Patient informed of what they had received and tolerated injection well. Patient notified to reach out to office if needed.

## 2024-05-23 ENCOUNTER — Other Ambulatory Visit: Payer: Self-pay | Admitting: Medical Genetics

## 2024-06-07 ENCOUNTER — Other Ambulatory Visit
Admission: RE | Admit: 2024-06-07 | Discharge: 2024-06-07 | Disposition: A | Discharge status: Home / Self Care | Payer: Self-pay | Source: Ambulatory Visit | Attending: Medical Genetics | Admitting: Medical Genetics

## 2024-06-19 LAB — GENECONNECT MOLECULAR SCREEN: Genetic Analysis Overall Interpretation: NEGATIVE

## 2024-08-06 ENCOUNTER — Other Ambulatory Visit: Payer: Self-pay | Admitting: Internal Medicine

## 2024-09-19 ENCOUNTER — Other Ambulatory Visit: Payer: Self-pay | Admitting: Internal Medicine

## 2024-09-19 DIAGNOSIS — Z1231 Encounter for screening mammogram for malignant neoplasm of breast: Secondary | ICD-10-CM

## 2024-10-18 ENCOUNTER — Ambulatory Visit
Admission: RE | Admit: 2024-10-18 | Discharge: 2024-10-18 | Disposition: A | Discharge status: Home / Self Care | Source: Ambulatory Visit | Attending: Internal Medicine | Admitting: Internal Medicine

## 2024-10-18 DIAGNOSIS — Z1231 Encounter for screening mammogram for malignant neoplasm of breast: Secondary | ICD-10-CM
